# Patient Record
Sex: Female | Born: 1990
Health system: Southern US, Community
[De-identification: ages and names within clinical notes are randomized; demographics above are authoritative.]

## PROBLEM LIST (undated history)

## (undated) DIAGNOSIS — G4700 Insomnia, unspecified: Secondary | ICD-10-CM

## (undated) DIAGNOSIS — F4310 Post-traumatic stress disorder, unspecified: Secondary | ICD-10-CM

## (undated) DIAGNOSIS — F419 Anxiety disorder, unspecified: Secondary | ICD-10-CM

## (undated) DIAGNOSIS — F1990 Other psychoactive substance use, unspecified, uncomplicated: Secondary | ICD-10-CM

## (undated) DIAGNOSIS — F909 Attention-deficit hyperactivity disorder, unspecified type: Secondary | ICD-10-CM

## (undated) DIAGNOSIS — F32A Depression, unspecified: Secondary | ICD-10-CM

## (undated) HISTORY — PX: DENTAL SURGERY: SHX609

---

## 2014-02-08 ENCOUNTER — Encounter (HOSPITAL_COMMUNITY): Payer: Self-pay | Admitting: Emergency Medicine

## 2014-02-08 DIAGNOSIS — Z79899 Other long term (current) drug therapy: Secondary | ICD-10-CM | POA: Insufficient documentation

## 2014-02-08 DIAGNOSIS — Z3202 Encounter for pregnancy test, result negative: Secondary | ICD-10-CM | POA: Insufficient documentation

## 2014-02-08 DIAGNOSIS — N200 Calculus of kidney: Secondary | ICD-10-CM | POA: Insufficient documentation

## 2014-02-08 LAB — URINE MICROSCOPIC-ADD ON

## 2014-02-08 LAB — URINALYSIS, ROUTINE W REFLEX MICROSCOPIC
Bilirubin Urine: NEGATIVE
Glucose, UA: NEGATIVE mg/dL
Ketones, ur: NEGATIVE mg/dL
Nitrite: NEGATIVE
PH: 5.5 (ref 5.0–8.0)
PROTEIN: 30 mg/dL — AB
Specific Gravity, Urine: 1.024 (ref 1.005–1.030)
Urobilinogen, UA: 0.2 mg/dL (ref 0.0–1.0)

## 2014-02-08 LAB — COMPREHENSIVE METABOLIC PANEL
ALBUMIN: 3.9 g/dL (ref 3.5–5.2)
ALT: 36 U/L — AB (ref 0–35)
AST: 29 U/L (ref 0–37)
Alkaline Phosphatase: 61 U/L (ref 39–117)
BUN: 8 mg/dL (ref 6–23)
CHLORIDE: 98 meq/L (ref 96–112)
CO2: 28 meq/L (ref 19–32)
Calcium: 9.7 mg/dL (ref 8.4–10.5)
Creatinine, Ser: 0.69 mg/dL (ref 0.50–1.10)
GFR calc Af Amer: >90 mL/min (ref >90)
Glucose, Bld: 107 mg/dL — ABNORMAL HIGH (ref 70–99)
Potassium: 3.5 mEq/L — ABNORMAL LOW (ref 3.7–5.3)
SODIUM: 140 meq/L (ref 137–147)
Total Bilirubin: 0.2 mg/dL — ABNORMAL LOW (ref 0.3–1.2)
Total Protein: 7.7 g/dL (ref 6.0–8.3)

## 2014-02-08 LAB — CBC WITH DIFFERENTIAL/PLATELET
BASOS ABS: 0 10*3/uL (ref 0.0–0.1)
BASOS PCT: 0 % (ref 0–1)
Eosinophils Absolute: 0.3 10*3/uL (ref 0.0–0.7)
Eosinophils Relative: 4 % (ref 0–5)
HCT: 36.3 % (ref 36.0–46.0)
Hemoglobin: 12.1 g/dL (ref 12.0–15.0)
LYMPHS PCT: 44 % (ref 12–46)
Lymphs Abs: 3.3 10*3/uL (ref 0.7–4.0)
MCH: 29.1 pg (ref 26.0–34.0)
MCHC: 33.3 g/dL (ref 30.0–36.0)
MCV: 87.3 fL (ref 78.0–100.0)
Monocytes Absolute: 0.7 10*3/uL (ref 0.1–1.0)
Monocytes Relative: 10 % (ref 3–12)
NEUTROS ABS: 3.1 10*3/uL (ref 1.7–7.7)
Neutrophils Relative %: 42 % — ABNORMAL LOW (ref 43–77)
PLATELETS: 222 10*3/uL (ref 150–400)
RBC: 4.16 MIL/uL (ref 3.87–5.11)
RDW: 14.1 % (ref 11.5–15.5)
WBC: 7.4 10*3/uL (ref 4.0–10.5)

## 2014-02-08 LAB — POC URINE PREG, ED: Preg Test, Ur: NEGATIVE

## 2014-02-08 LAB — LIPASE, BLOOD: Lipase: 26 U/L (ref 11–59)

## 2014-02-08 MED ORDER — FENTANYL CITRATE 0.05 MG/ML IJ SOLN
50.0000 ug | Freq: Once | INTRAMUSCULAR | Status: AC
  Administered 2014-02-08: 50 ug via INTRAVENOUS
  Filled 2014-02-08: qty 2

## 2014-02-08 MED ORDER — ONDANSETRON HCL 4 MG/2ML IJ SOLN
4.0000 mg | Freq: Once | INTRAMUSCULAR | Status: AC
  Administered 2014-02-08: 4 mg via INTRAVENOUS
  Filled 2014-02-08: qty 2

## 2014-02-08 NOTE — ED Notes (Signed)
Pt. reports right abdominal pain with emesis onset this evening , denies diarrhea/fever or chills. No urinary discomfort .

## 2014-02-09 ENCOUNTER — Emergency Department (HOSPITAL_COMMUNITY): Payer: Self-pay

## 2014-02-09 ENCOUNTER — Emergency Department (HOSPITAL_COMMUNITY)
Admission: EM | Admit: 2014-02-09 | Discharge: 2014-02-09 | Disposition: A | Discharge status: Home / Self Care | Payer: Self-pay | Attending: Emergency Medicine | Admitting: Emergency Medicine

## 2014-02-09 DIAGNOSIS — N201 Calculus of ureter: Secondary | ICD-10-CM

## 2014-02-09 DIAGNOSIS — N23 Unspecified renal colic: Secondary | ICD-10-CM

## 2014-02-09 MED ORDER — TAMSULOSIN HCL 0.4 MG PO CAPS: 0.4000 mg | ORAL_CAPSULE | Freq: Every day | ORAL | Status: DC

## 2014-02-09 MED ORDER — HYDROMORPHONE HCL PF 1 MG/ML IJ SOLN
1.0000 mg | Freq: Once | INTRAMUSCULAR | Status: AC
  Administered 2014-02-09: 1 mg via INTRAVENOUS
  Filled 2014-02-09: qty 1

## 2014-02-09 MED ORDER — ONDANSETRON HCL 4 MG PO TABS: 4.0000 mg | ORAL_TABLET | Freq: Four times a day (QID) | ORAL | Status: DC

## 2014-02-09 MED ORDER — OXYCODONE-ACETAMINOPHEN 5-325 MG PO TABS: 1.0000 | ORAL_TABLET | Freq: Four times a day (QID) | ORAL | Status: DC | PRN

## 2014-02-09 NOTE — ED Notes (Signed)
Pt admits to acute onset of RLQ that began this evening - pt denies n/v/d - admits to no menstrual period x3-4 months. Pt is pleasant, A&Ox4, no acute distress, states pain has not been alleviated by previous pain medication. Pt also admits to urinary frequency and dysuria.

## 2014-02-09 NOTE — ED Notes (Signed)
Pt ambulating independently w/ steady gait on d/c in no acute distress, A&Ox4. D/c instructions reviewed w/ pt - pt denies any further questions or concerns at present. Rx given x3 Pt's friend to drive pt home.

## 2014-02-09 NOTE — Discharge Instructions (Signed)
Return to the ED with any concerns including fever/chills, vomiting and not able to keep down liquids, pain not controlled by pain medications, decreased level of alertness/lethargy, or any other alarming symptoms °

## 2014-02-09 NOTE — ED Provider Notes (Signed)
CSN: [id number]     Arrival date & time 02/08/14  2031 History   First MD Initiated Contact with Patient 02/09/14 [phone]     Chief Complaint  Patient presents with  . Abdominal Pain     (Consider location/radiation/quality/duration/timing/severity/associated sxs/prior Treatment) HPI Pt presents with c/o right lower abdominal pain which began earlier this evening. Pain gradually worsened and then became unbearable.  Pt also feels pain in suprapubic region. No flank pain.  Had one episode of emesis at onset of pain.  No diarrhea.  No fever/chills.  Has not had similar pain in the past.  Pain is fluctuating in nature.  Denies dysuria, no urgency or frequency.  No vaginal bleeding or discharge.  There are no other associated systemic symptoms, there are no other alleviating or modifying factors.   History reviewed. No pertinent past medical history. History reviewed. No pertinent past surgical history. No family history on file. History  Substance Use Topics  . Smoking status: Never Smoker   . Smokeless tobacco: Not on file  . Alcohol Use: No   OB History   Grav Para Term Preterm Abortions TAB SAB Ect Mult Living                 Review of Systems ROS reviewed and all otherwise negative except for mentioned in HPI    Allergies  Review of patient's allergies indicates no known allergies.  Home Medications   Prior to Admission medications   Medication Sig Start Date End Date Taking? Authorizing Provider  ibuprofen (ADVIL,MOTRIN) 200 MG tablet Take 400 mg by mouth every 6 (six) hours as needed for mild pain.   Yes Historical Provider, MD  ondansetron (ZOFRAN) 4 MG tablet Take 1 tablet (4 mg total) by mouth every 6 (six) hours. 02/09/14   Ethelda ChickMartha K Linker, MD  oxyCODONE-acetaminophen (PERCOCET/ROXICET) 5-325 MG per tablet Take 1-2 tablets by mouth every 6 (six) hours as needed for severe pain. 02/09/14   Ethelda ChickMartha K Linker, MD  tamsulosin (FLOMAX) 0.4 MG CAPS capsule Take 1 capsule (0.4 mg  total) by mouth daily. 02/09/14   Ethelda ChickMartha K Linker, MD   BP 119/90  Pulse 100  Temp(Src) 98 F (36.7 C) (Oral)  Resp 16  SpO2 99% Vitals reviewed Physical Exam Physical Examination: General appearance - alert, well appearing, and in no distress Mental status - alert, oriented to person, place, and time Eyes - no conjunctival injection, no scleral icterus Mouth - mucous membranes moist, pharynx normal without lesions Chest - clear to auscultation, no wheezes, rales or rhonchi, symmetric air entry Heart - normal rate, regular rhythm, normal S1, S2, no murmurs, rubs, clicks or gallops Abdomen - soft, nontender, nondistended, no masses or organomegaly Back exam - no CVA tenderness, no midline tenderness to palpation Extremities - peripheral pulses normal, no pedal edema, no clubbing or cyanosis Skin - normal coloration and turgor, no rashes  ED Course  Procedures (including critical care time) Labs Review Labs Reviewed  CBC WITH DIFFERENTIAL - Abnormal; Notable for the following:    Neutrophils Relative % 42 (*)    All other components within normal limits  COMPREHENSIVE METABOLIC PANEL - Abnormal; Notable for the following:    Potassium 3.5 (*)    Glucose, Bld 107 (*)    ALT 36 (*)    Total Bilirubin 0.2 (*)    All other components within normal limits  URINALYSIS, ROUTINE W REFLEX MICROSCOPIC - Abnormal; Notable for the following:    APPearance TURBID (*)  Hgb urine dipstick LARGE (*)    Protein, ur 30 (*)    Leukocytes, UA SMALL (*)    All other components within normal limits  URINE MICROSCOPIC-ADD ON - Abnormal; Notable for the following:    Squamous Epithelial / LPF MANY (*)    Bacteria, UA MANY (*)    Crystals CA OXALATE CRYSTALS (*)    All other components within normal limits  LIPASE, BLOOD  POC URINE PREG, ED    Imaging Review No results found.   EKG Interpretation None      MDM   Final diagnoses:  Right ureteral stone  Renal colic    Pt  presenting with right lower abdominal pain and suprapubic pain.  Labs reassuring.  CT scan reveals 2mm distal ureteral stone. No signs of infection, renal function normal.  Pt treated for pain, will d/w with po meds nad f/u with urology.  Discharged with strict return precautions.  Pt agreeable with plan.  Nursing notes including past medical history and social history reviewed and considered in documentation  There were no prior records available for review in this patient.     Ethelda ChickMartha K Linker, MD 02/13/14 [phone]

## 2014-02-09 NOTE — ED Notes (Signed)
Patient transported to CT 

## 2014-10-21 ENCOUNTER — Encounter (HOSPITAL_COMMUNITY): Payer: Self-pay | Admitting: *Deleted

## 2014-10-21 DIAGNOSIS — Z3202 Encounter for pregnancy test, result negative: Secondary | ICD-10-CM | POA: Insufficient documentation

## 2014-10-21 DIAGNOSIS — Z79899 Other long term (current) drug therapy: Secondary | ICD-10-CM | POA: Insufficient documentation

## 2014-10-21 DIAGNOSIS — R1111 Vomiting without nausea: Secondary | ICD-10-CM | POA: Insufficient documentation

## 2014-10-21 LAB — POC URINE PREG, ED: PREG TEST UR: NEGATIVE

## 2014-10-21 MED ORDER — ONDANSETRON 4 MG PO TBDP
8.0000 mg | ORAL_TABLET | Freq: Once | ORAL | Status: AC
  Administered 2014-10-21: 8 mg via ORAL
  Filled 2014-10-21: qty 2

## 2014-10-21 NOTE — ED Notes (Signed)
She has irregular periods

## 2014-10-21 NOTE — ED Notes (Signed)
n  V since this am.  She is unable to eat.  lmpjan 13th

## 2014-10-22 ENCOUNTER — Emergency Department (HOSPITAL_COMMUNITY)
Admission: EM | Admit: 2014-10-22 | Discharge: 2014-10-22 | Disposition: A | Discharge status: Home / Self Care | Payer: Self-pay | Attending: Emergency Medicine | Admitting: Emergency Medicine

## 2014-10-22 DIAGNOSIS — R1111 Vomiting without nausea: Secondary | ICD-10-CM

## 2014-10-22 LAB — URINALYSIS, ROUTINE W REFLEX MICROSCOPIC
Glucose, UA: NEGATIVE mg/dL
Hgb urine dipstick: NEGATIVE
Ketones, ur: 15 mg/dL — AB
LEUKOCYTES UA: NEGATIVE
Nitrite: NEGATIVE
PH: 6 (ref 5.0–8.0)
Protein, ur: NEGATIVE mg/dL
SPECIFIC GRAVITY, URINE: 1.027 (ref 1.005–1.030)
Urobilinogen, UA: 1 mg/dL (ref 0.0–1.0)

## 2014-10-22 MED ORDER — ONDANSETRON 8 MG PO TBDP: 4.0000 mg | ORAL_TABLET | Freq: Three times a day (TID) | ORAL | Status: DC | PRN

## 2014-10-22 NOTE — ED Notes (Signed)
Patient stated about 1030 Sunday morning she woke up with N/V  Stated she has not been able to keep anything down.  Talked about the BRAT diet.  Patient voiced understanding.

## 2014-10-22 NOTE — Discharge Instructions (Signed)
You were seen today for nausea and vomiting. I suspect her symptoms are likely secondary to a virus. It is important that you stay hydrated. He will be given nausea medication. If you develop abdominal pain, cannot keep down fluids or any new or worsening symptoms she should be evaluated immediately.  Nausea and Vomiting Nausea is a sick feeling that often comes before throwing up (vomiting). Vomiting is a reflex where stomach contents come out of your mouth. Vomiting can cause severe loss of body fluids (dehydration). Children and elderly adults can become dehydrated quickly, especially if they also have diarrhea. Nausea and vomiting are symptoms of a condition or disease. It is important to find the cause of your symptoms. CAUSES   Direct irritation of the stomach lining. This irritation can result from increased acid production (gastroesophageal reflux disease), infection, food poisoning, taking certain medicines (such as nonsteroidal anti-inflammatory drugs), alcohol use, or tobacco use.  Signals from the brain.These signals could be caused by a headache, heat exposure, an inner ear disturbance, increased pressure in the brain from injury, infection, a tumor, or a concussion, pain, emotional stimulus, or metabolic problems.  An obstruction in the gastrointestinal tract (bowel obstruction).  Illnesses such as diabetes, hepatitis, gallbladder problems, appendicitis, kidney problems, cancer, sepsis, atypical symptoms of a heart attack, or eating disorders.  Medical treatments such as chemotherapy and radiation.  Receiving medicine that makes you sleep (general anesthetic) during surgery. DIAGNOSIS Your caregiver may ask for tests to be done if the problems do not improve after a few days. Tests may also be done if symptoms are severe or if the reason for the nausea and vomiting is not clear. Tests may include:  Urine tests.  Blood tests.  Stool tests.  Cultures (to look for evidence of  infection).  X-rays or other imaging studies. Test results can help your caregiver make decisions about treatment or the need for additional tests. TREATMENT You need to stay well hydrated. Drink frequently but in small amounts.You may wish to drink water, sports drinks, clear broth, or eat frozen ice pops or gelatin dessert to help stay hydrated.When you eat, eating slowly may help prevent nausea.There are also some antinausea medicines that may help prevent nausea. HOME CARE INSTRUCTIONS   Take all medicine as directed by your caregiver.  If you do not have an appetite, do not force yourself to eat. However, you must continue to drink fluids.  If you have an appetite, eat a normal diet unless your caregiver tells you differently.  Eat a variety of complex carbohydrates (rice, wheat, potatoes, bread), lean meats, yogurt, fruits, and vegetables.  Avoid high-fat foods because they are more difficult to digest.  Drink enough water and fluids to keep your urine clear or pale yellow.  If you are dehydrated, ask your caregiver for specific rehydration instructions. Signs of dehydration may include:  Severe thirst.  Dry lips and mouth.  Dizziness.  Dark urine.  Decreasing urine frequency and amount.  Confusion.  Rapid breathing or pulse. SEEK IMMEDIATE MEDICAL CARE IF:   You have blood or brown flecks (like coffee grounds) in your vomit.  You have black or bloody stools.  You have a severe headache or stiff neck.  You are confused.  You have severe abdominal pain.  You have chest pain or trouble breathing.  You do not urinate at least once every 8 hours.  You develop cold or clammy skin.  You continue to vomit for longer than 24 to 48 hours.  You have a fever. MAKE SURE YOU:   Understand these instructions.  Will watch your condition.  Will get help right away if you are not doing well or get worse. Document Released: 08/03/2005 Document Revised: 10/26/2011  Document Reviewed: 12/31/2010 [facility] Patient Information 2015 [place]. This information is not intended to replace advice given to you by your health care provider. Make sure you discuss any questions you have with your health care provider.

## 2014-10-22 NOTE — ED Provider Notes (Signed)
CSN: [id number]     Arrival date & time 10/21/14  2310 History   First MD Initiated Contact with Patient 10/22/14 0033     This chart was scribed for Shon Batonourtney F Horton, MD by Arlan OrganAshley Leger, ED Scribe. This patient was seen in room A02C/A02C and the patient's care was started 12:42 AM.   Chief Complaint  Patient presents with  . Emesis   The history is provided by the patient. No language interpreter was used.    HPI Comments: Sandra Guzman is a 24 y.o. female without any pertinent past medical history who presents to the Emergency Department complaining of constant, ongoing vomiting onset early this morning. She also reports mild diffuse abdominal discomfort that is worsened with vomiting. No diarrhea, fever, or chills. Pt admits to previous heroin abuse and states she has been on Methadone since January 2016. Since starting medication, pt has been taking Methadone as prescribed and directed. No recent missed doses. LNMP 1/13. However, pt states her menstrual periods are typically irregular. No known allergies to medications.  History reviewed. No pertinent past medical history. History reviewed. No pertinent past surgical history. No family history on file. History  Substance Use Topics  . Smoking status: Never Smoker   . Smokeless tobacco: Not on file  . Alcohol Use: No   OB History    No data available     Review of Systems  Constitutional: Negative for fever and chills.  Respiratory: Negative for chest tightness and shortness of breath.   Cardiovascular: Negative for chest pain.  Gastrointestinal: Positive for nausea, vomiting and abdominal pain. Negative for diarrhea.  Genitourinary: Negative for dysuria and flank pain.  Musculoskeletal: Negative for back pain.  Neurological: Negative for headaches.  All other systems reviewed and are negative.     Allergies  Review of patient's allergies indicates no known allergies.  Home Medications   Prior to Admission medications    Medication Sig Start Date End Date Taking? Authorizing Provider  diphenhydrAMINE (BENADRYL) 25 MG tablet Take 50 mg by mouth every 6 (six) hours as needed for allergies.   Yes Historical Provider, MD  methadone (DOLOPHINE) 10 MG/ML solution Take 40 mg by mouth daily.   Yes Historical Provider, MD  naproxen sodium (ANAPROX) 220 MG tablet Take 440 mg by mouth 4 (four) times daily as needed (pain).   Yes Historical Provider, MD  ibuprofen (ADVIL,MOTRIN) 200 MG tablet Take 400 mg by mouth every 6 (six) hours as needed for mild pain.    Historical Provider, MD  ondansetron (ZOFRAN) 4 MG tablet Take 1 tablet (4 mg total) by mouth every 6 (six) hours. Patient not taking: Reported on 10/22/2014 02/09/14   Ethelda ChickMartha K Linker, MD  ondansetron (ZOFRAN-ODT) 8 MG disintegrating tablet Take 0.5 tablets (4 mg total) by mouth every 8 (eight) hours as needed for nausea or vomiting. 10/22/14   Shon Batonourtney F Horton, MD  oxyCODONE-acetaminophen (PERCOCET/ROXICET) 5-325 MG per tablet Take 1-2 tablets by mouth every 6 (six) hours as needed for severe pain. Patient not taking: Reported on 10/22/2014 02/09/14   Ethelda ChickMartha K Linker, MD  tamsulosin (FLOMAX) 0.4 MG CAPS capsule Take 1 capsule (0.4 mg total) by mouth daily. Patient not taking: Reported on 10/22/2014 02/09/14   Ethelda ChickMartha K Linker, MD   Triage Vitals: BP 133/83 mmHg  Pulse 120  Temp(Src) 97.6 F (36.4 C)  Resp 18  Ht 5\' 1"  (1.549 m)  Wt 120 lb (54.432 kg)  BMI 22.69 kg/m2  SpO2 97%  LMP 10/08/2014  Physical Exam  Constitutional: She is oriented to person, place, and time. She appears well-developed and well-nourished.  Thin  HENT:  Head: Normocephalic and atraumatic.  Eyes: Pupils are equal, round, and reactive to light.  Cardiovascular: Normal rate, regular rhythm and normal heart sounds.   No murmur heard. Pulmonary/Chest: Effort normal and breath sounds normal. No respiratory distress. She has no wheezes.  Abdominal: Soft. Bowel sounds are normal. There is no  tenderness. There is no rebound.  Musculoskeletal: She exhibits no edema.  Neurological: She is alert and oriented to person, place, and time.  Skin: Skin is warm and dry.  Psychiatric: She has a normal mood and affect.  Nursing note and vitals reviewed.   ED Course  Procedures (including critical care time)  DIAGNOSTIC STUDIES: Oxygen Saturation is 97% on RA, Adequate by my interpretation.    COORDINATION OF CARE: 12:37 AM-Discussed treatment plan with pt at bedside and pt agreed to plan.     Labs Review Labs Reviewed  URINALYSIS, ROUTINE W REFLEX MICROSCOPIC - Abnormal; Notable for the following:    Color, Urine AMBER (*)    APPearance CLOUDY (*)    Bilirubin Urine SMALL (*)    Ketones, ur 15 (*)    All other components within normal limits  POC URINE PREG, ED    Imaging Review No results found.   EKG Interpretation None      MDM   Final diagnoses:  Non-intractable vomiting without nausea, vomiting of unspecified type    Patient presents with vomiting. She is nontoxic on exam. Vital signs are reassuring. Initial heart rate 120; however, on my evaluation, heart rate in the mid 90s. No abdominal tenderness. Patient reports that she has been taking her methadone as prescribed and have low suspicion at this time for withdrawal symptom. She does not appear overtly dehydrated. 15 ketones in the urine. She is not pregnant.  Patient is status post Zofran and tolerating fluids. Discussed with patient likelihood that this is gastritis versus gastroenteritis. Patient was given instructions regarding supportive care.  After history, exam, and medical workup I feel the patient has been appropriately medically screened and is safe for discharge home. Pertinent diagnoses were discussed with the patient. Patient was given return precautions.  I personally performed the services described in this documentation, which was scribed in my presence. The recorded information has been  reviewed and is accurate.   Shon Baton, MD 10/22/14 [phone]

## 2015-03-29 ENCOUNTER — Encounter (HOSPITAL_COMMUNITY): Payer: Self-pay | Admitting: Emergency Medicine

## 2015-03-29 ENCOUNTER — Emergency Department (HOSPITAL_COMMUNITY): Payer: Medicaid Other

## 2015-03-29 ENCOUNTER — Inpatient Hospital Stay (HOSPITAL_COMMUNITY)
Admission: EM | Admit: 2015-03-29 | Discharge: 2015-03-30 | DRG: 781 | Discharge status: Against Medical Advice | Payer: Medicaid Other | Attending: Internal Medicine | Admitting: Internal Medicine

## 2015-03-29 DIAGNOSIS — F1110 Opioid abuse, uncomplicated: Secondary | ICD-10-CM | POA: Diagnosis present

## 2015-03-29 DIAGNOSIS — Z3490 Encounter for supervision of normal pregnancy, unspecified, unspecified trimester: Secondary | ICD-10-CM

## 2015-03-29 DIAGNOSIS — O99331 Smoking (tobacco) complicating pregnancy, first trimester: Secondary | ICD-10-CM | POA: Diagnosis present

## 2015-03-29 DIAGNOSIS — O98811 Other maternal infectious and parasitic diseases complicating pregnancy, first trimester: Secondary | ICD-10-CM | POA: Diagnosis present

## 2015-03-29 DIAGNOSIS — F17210 Nicotine dependence, cigarettes, uncomplicated: Secondary | ICD-10-CM | POA: Diagnosis present

## 2015-03-29 DIAGNOSIS — O99321 Drug use complicating pregnancy, first trimester: Secondary | ICD-10-CM | POA: Diagnosis present

## 2015-03-29 DIAGNOSIS — A419 Sepsis, unspecified organism: Secondary | ICD-10-CM | POA: Diagnosis not present

## 2015-03-29 DIAGNOSIS — Z3A11 11 weeks gestation of pregnancy: Secondary | ICD-10-CM | POA: Diagnosis present

## 2015-03-29 DIAGNOSIS — F1120 Opioid dependence, uncomplicated: Secondary | ICD-10-CM | POA: Diagnosis present

## 2015-03-29 DIAGNOSIS — N12 Tubulo-interstitial nephritis, not specified as acute or chronic: Secondary | ICD-10-CM | POA: Diagnosis not present

## 2015-03-29 DIAGNOSIS — A4151 Sepsis due to Escherichia coli [E. coli]: Secondary | ICD-10-CM | POA: Diagnosis present

## 2015-03-29 DIAGNOSIS — N390 Urinary tract infection, site not specified: Secondary | ICD-10-CM | POA: Diagnosis present

## 2015-03-29 LAB — LIPASE, BLOOD: LIPASE: 13 U/L — AB (ref 22–51)

## 2015-03-29 LAB — CBC WITH DIFFERENTIAL/PLATELET
Basophils Absolute: 0 10*3/uL (ref 0.0–0.1)
Basophils Relative: 0 % (ref 0–1)
EOS ABS: 0 10*3/uL (ref 0.0–0.7)
Eosinophils Relative: 0 % (ref 0–5)
HCT: 32.3 % — ABNORMAL LOW (ref 36.0–46.0)
Hemoglobin: 11.2 g/dL — ABNORMAL LOW (ref 12.0–15.0)
Lymphocytes Relative: 4 % — ABNORMAL LOW (ref 12–46)
Lymphs Abs: 0.6 10*3/uL — ABNORMAL LOW (ref 0.7–4.0)
MCH: 29.7 pg (ref 26.0–34.0)
MCHC: 34.7 g/dL (ref 30.0–36.0)
MCV: 85.7 fL (ref 78.0–100.0)
Monocytes Absolute: 1.4 10*3/uL — ABNORMAL HIGH (ref 0.1–1.0)
Monocytes Relative: 9 % (ref 3–12)
NEUTROS ABS: 13.4 10*3/uL — AB (ref 1.7–7.7)
NEUTROS PCT: 87 % — AB (ref 43–77)
Platelets: 172 10*3/uL (ref 150–400)
RBC: 3.77 MIL/uL — AB (ref 3.87–5.11)
RDW: 13.4 % (ref 11.5–15.5)
WBC: 15.4 10*3/uL — AB (ref 4.0–10.5)

## 2015-03-29 LAB — LACTIC ACID, PLASMA: Lactic Acid, Venous: 2.2 mmol/L (ref 0.5–2.0)

## 2015-03-29 LAB — COMPREHENSIVE METABOLIC PANEL
ALT: 18 U/L (ref 14–54)
AST: 25 U/L (ref 15–41)
Albumin: 3.2 g/dL — ABNORMAL LOW (ref 3.5–5.0)
Alkaline Phosphatase: 56 U/L (ref 38–126)
Anion gap: 11 (ref 5–15)
BILIRUBIN TOTAL: 0.7 mg/dL (ref 0.3–1.2)
BUN: <5 mg/dL — ABNORMAL LOW (ref 6–20)
CALCIUM: 8.9 mg/dL (ref 8.9–10.3)
CO2: 22 mmol/L (ref 22–32)
CREATININE: 0.66 mg/dL (ref 0.44–1.00)
Chloride: 97 mmol/L — ABNORMAL LOW (ref 101–111)
GFR calc Af Amer: >60 mL/min (ref >60)
GLUCOSE: 114 mg/dL — AB (ref 65–99)
Potassium: 3.2 mmol/L — ABNORMAL LOW (ref 3.5–5.1)
Sodium: 130 mmol/L — ABNORMAL LOW (ref 135–145)
Total Protein: 6.7 g/dL (ref 6.5–8.1)

## 2015-03-29 LAB — URINALYSIS, ROUTINE W REFLEX MICROSCOPIC
Bilirubin Urine: NEGATIVE
GLUCOSE, UA: NEGATIVE mg/dL
Ketones, ur: NEGATIVE mg/dL
Nitrite: POSITIVE — AB
PH: 7 (ref 5.0–8.0)
Protein, ur: 100 mg/dL — AB
SPECIFIC GRAVITY, URINE: 1.016 (ref 1.005–1.030)
Urobilinogen, UA: 1 mg/dL (ref 0.0–1.0)

## 2015-03-29 LAB — HCG, QUANTITATIVE, PREGNANCY: hCG, Beta Chain, Quant, S: 104183 m[IU]/mL — ABNORMAL HIGH (ref <5)

## 2015-03-29 LAB — URINE MICROSCOPIC-ADD ON

## 2015-03-29 LAB — POC URINE PREG, ED: Preg Test, Ur: POSITIVE — AB

## 2015-03-29 LAB — MAGNESIUM: Magnesium: 1.3 mg/dL — ABNORMAL LOW (ref 1.7–2.4)

## 2015-03-29 LAB — I-STAT CG4 LACTIC ACID, ED
Lactic Acid, Venous: 0.66 mmol/L (ref 0.5–2.0)
Lactic Acid, Venous: 0.83 mmol/L (ref 0.5–2.0)

## 2015-03-29 MED ORDER — ACETAMINOPHEN 160 MG/5ML PO SOLN
650.0000 mg | Freq: Once | ORAL | Status: AC
  Administered 2015-03-29: 650 mg via ORAL
  Filled 2015-03-29: qty 20.3

## 2015-03-29 MED ORDER — MORPHINE SULFATE 2 MG/ML IJ SOLN
2.0000 mg | Freq: Once | INTRAMUSCULAR | Status: AC
  Administered 2015-03-29: 2 mg via INTRAVENOUS
  Filled 2015-03-29: qty 1

## 2015-03-29 MED ORDER — ONDANSETRON HCL 4 MG/2ML IJ SOLN
4.0000 mg | Freq: Once | INTRAMUSCULAR | Status: AC
  Administered 2015-03-29: 4 mg via INTRAVENOUS
  Filled 2015-03-29: qty 2

## 2015-03-29 MED ORDER — SODIUM CHLORIDE 0.9 % IV SOLN
INTRAVENOUS | Status: DC
  Administered 2015-03-29: 20:00:00 via INTRAVENOUS

## 2015-03-29 MED ORDER — ONDANSETRON HCL 4 MG/2ML IJ SOLN: 4.0000 mg | Freq: Four times a day (QID) | INTRAMUSCULAR | Status: DC | PRN

## 2015-03-29 MED ORDER — POTASSIUM CHLORIDE CRYS ER 20 MEQ PO TBCR
40.0000 meq | EXTENDED_RELEASE_TABLET | Freq: Two times a day (BID) | ORAL | Status: DC
  Administered 2015-03-29: 40 meq via ORAL
  Filled 2015-03-29 (×2): qty 2

## 2015-03-29 MED ORDER — FOLIC ACID 1 MG PO TABS
1.0000 mg | ORAL_TABLET | Freq: Every day | ORAL | Status: DC
  Administered 2015-03-29 – 2015-03-30 (×2): 1 mg via ORAL
  Filled 2015-03-29 (×2): qty 1

## 2015-03-29 MED ORDER — DEXTROSE 5 % IV SOLN
1.0000 g | INTRAVENOUS | Status: DC
  Filled 2015-03-29: qty 10

## 2015-03-29 MED ORDER — SODIUM CHLORIDE 0.9 % IV BOLUS (SEPSIS)
1000.0000 mL | INTRAVENOUS | Status: AC
  Administered 2015-03-29 (×2): 1000 mL via INTRAVENOUS

## 2015-03-29 MED ORDER — SODIUM CHLORIDE 0.9 % IV BOLUS (SEPSIS): 500.0000 mL | Freq: Once | INTRAVENOUS | Status: DC

## 2015-03-29 MED ORDER — DEXTROSE 5 % IV SOLN
2.0000 g | Freq: Once | INTRAVENOUS | Status: DC
  Administered 2015-03-29: 2 g via INTRAVENOUS
  Filled 2015-03-29: qty 2

## 2015-03-29 MED ORDER — METHADONE HCL 10 MG PO TABS
65.0000 mg | ORAL_TABLET | Freq: Every day | ORAL | Status: DC
  Administered 2015-03-30: 65 mg via ORAL
  Filled 2015-03-29: qty 7

## 2015-03-29 MED ORDER — DEXTROSE 5 % IV SOLN
1.0000 g | Freq: Two times a day (BID) | INTRAVENOUS | Status: DC
  Administered 2015-03-30 (×2): 1 g via INTRAVENOUS
  Filled 2015-03-29 (×3): qty 10

## 2015-03-29 MED ORDER — POTASSIUM CHLORIDE 10 MEQ/100ML IV SOLN
10.0000 meq | INTRAVENOUS | Status: DC
  Filled 2015-03-29: qty 100

## 2015-03-29 MED ORDER — ACETAMINOPHEN 500 MG PO TABS
500.0000 mg | ORAL_TABLET | Freq: Four times a day (QID) | ORAL | Status: DC | PRN
  Filled 2015-03-29: qty 1

## 2015-03-29 MED ORDER — MORPHINE SULFATE 2 MG/ML IJ SOLN
2.0000 mg | INTRAMUSCULAR | Status: DC | PRN
  Administered 2015-03-29 – 2015-03-30 (×3): 2 mg via INTRAVENOUS
  Filled 2015-03-29 (×3): qty 1

## 2015-03-29 MED ORDER — SODIUM CHLORIDE 0.9 % IV BOLUS (SEPSIS)
1000.0000 mL | Freq: Once | INTRAVENOUS | Status: AC
  Administered 2015-03-29: 1000 mL via INTRAVENOUS

## 2015-03-29 NOTE — H&P (Signed)
[facility] History and Physical  Sheriden Archibeque ZOX:[id number] DOB: [birth year] DOA: 03/29/2015  Referring physician: EDP PCP: No PCP Per Patient   Chief Complaint: flank pain  HPI: Sandra Guzman is a 24 y.o. female with PMH of heroin abuse, currently on methadone still using Iv heroin intermittently who is [redacted] weeks pregnant, presents to the ER with L flank pain, nausea, chills, feeling feverish, and dysuria. Her symptoms started on Monday and have progressively worsened Due to worsening fo symptoms came to the ER today, noted to have temp of 103, tachycardia, UTI, [redacted] weeks gestation confirmed on [place].   Review of Systems: positives bolded Constitutional:  No weight loss, night sweats, Fevers, chills, fatigue.  HEENT:  No headaches, Difficulty swallowing,Tooth/dental problems,Sore throat,  No sneezing, itching, ear ache, nasal congestion, post nasal drip,  Cardio-vascular:  No chest pain, Orthopnea, PND, swelling in lower extremities, anasarca, dizziness, palpitations  GI:  No heartburn, indigestion, abdominal pain, nausea, vomiting, diarrhea, change in bowel habits, loss of appetite  Resp:  No shortness of breath with exertion or at rest. No excess mucus, no productive cough, No non-productive cough, No coughing up of blood.No change in color of mucus.No wheezing.No chest wall deformity  Skin:  no rash or lesions.  GU:  no dysuria, change in color of urine, no urgency or frequency. No flank pain.  Musculoskeletal:  No joint pain or swelling. No decreased range of motion. No back pain.  Psych:  No change in mood or affect. No depression or anxiety. No memory loss.   History reviewed. No pertinent past medical history. History reviewed. No pertinent past surgical history. Social History:  Now lives with a friend, no ETOH, smokes 1PPD, IVDA, used to use 1.5gm heroin/day -iv until a year ago, now uses methadone and IV DA occasionally  No Known Allergies  family  history -unknown  Prior to Admission medications   Medication Sig Start Date End Date Taking? Authorizing Provider  acetaminophen (TYLENOL) 500 MG tablet Take 2,000 mg by mouth every 6 (six) hours as needed for mild pain.   Yes Historical Provider, MD  diphenhydrAMINE (BENADRYL) 25 MG tablet Take 50 mg by mouth every 6 (six) hours as needed for allergies.   Yes Historical Provider, MD  ondansetron (ZOFRAN) 4 MG tablet Take 1 tablet (4 mg total) by mouth every 6 (six) hours. Patient not taking: Reported on 10/22/2014 02/09/14   Jerelyn Scott, MD  ondansetron (ZOFRAN-ODT) 8 MG disintegrating tablet Take 0.5 tablets (4 mg total) by mouth every 8 (eight) hours as needed for nausea or vomiting. Patient not taking: Reported on 03/29/2015 10/22/14   Shon Baton, MD  oxyCODONE-acetaminophen (PERCOCET/ROXICET) 5-325 MG per tablet Take 1-2 tablets by mouth every 6 (six) hours as needed for severe pain. Patient not taking: Reported on 10/22/2014 02/09/14   Jerelyn Scott, MD  tamsulosin (FLOMAX) 0.4 MG CAPS capsule Take 1 capsule (0.4 mg total) by mouth daily. Patient not taking: Reported on 10/22/2014 02/09/14   Jerelyn Scott, MD   Physical Exam: Filed Vitals:   03/29/15 1400 03/29/15 1434 03/29/15 1630 03/29/15 1700  BP: 103/51  94/44 100/52  Pulse: 100  85 82  Temp:  100.5 F (38.1 C)    TempSrc:  Rectal    Resp: Height:      Weight:      SpO2: 98%  100% 100%    Wt Readings from Last 3 Encounters:  03/29/15 59.903 kg (132 lb 1 oz)  10/21/14  54.432 kg (120 lb)    General:  Ill appearing, but no distress Eyes: PERRL, normal lids, irises & conjunctiva ENT: grossly normal hearing, lips & tongue Neck: no LAD, masses or thyromegaly Cardiovascular: RRR, no m/r/g. No LE edema. Telemetry: SR, no arrhythmias  Respiratory: CTA bilaterally, no w/r/r. Normal respiratory effort. Abdomen: soft, ntnd, L flank tender, BS present Skin: no rash or induration seen on limited exam Musculoskeletal:  grossly normal tone BUE/BLE Psychiatric: grossly normal mood and affect, speech fluent and appropriate Neurologic: grossly non-focal.          Labs on Admission:  Basic Metabolic Panel:  Recent Labs Lab 03/29/15 1346  NA 130*  K 3.2*  CL 97*  CO2 22  GLUCOSE 114*  BUN <5*  CREATININE 0.66  CALCIUM 8.9   Liver Function Tests:  Recent Labs Lab 03/29/15 1346  AST 25  ALT 18  ALKPHOS 56  BILITOT 0.7  PROT 6.7  ALBUMIN 3.2*    Recent Labs Lab 03/29/15 1346  LIPASE 13*   No results for input(s): AMMONIA in the last 168 hours. CBC:  Recent Labs Lab 03/29/15 1346  WBC 15.4*  NEUTROABS 13.4*  HGB 11.2*  HCT 32.3*  MCV 85.7  PLT 172   Cardiac Enzymes: No results for input(s): CKTOTAL, CKMB, CKMBINDEX, TROPONINI in the last 168 hours.  BNP (last 3 results) No results for input(s): BNP in the last 8760 hours.  ProBNP (last 3 results) No results for input(s): PROBNP in the last 8760 hours.  CBG: No results for input(s): GLUCAP in the last 168 hours.  Radiological Exams on Admission: US Ob Comp Less 14 Wks  03/29/2015   CLINICAL DATA:  Positive urine pregnancy test.  EXAM: OBSTETRIC <14 WK ULTRASOUND  TECHNIQUE: Transabdominal ultrasound was performed for evaluation of the gestation as well as the maternal uterus and adnexal regions.  COMPARISON:  None.  FINDINGS: Intrauterine gestational sac: Visualized/normal in shape.  Yolk sac:  Yes  Embryo:  Yes  Cardiac Activity: Yes  Heart Rate: 178 bpm  CRL:   41.3  mm   11 w 0 d                  [place] EDC: 10/18/2015  Maternal uterus/adnexae: No uterine masses. No subchorionic hemorrhage. Cervix is closed. Normal ovaries. No adnexal masses. No free fluid.  IMPRESSION: 1. Single live intrauterine pregnancy with a measured gestational age of [redacted] weeks. 2. No emergent pregnancy or maternal findings.   Electronically Signed   By: Amie Portland M.D.   On: 03/29/2015 16:18   US Renal  03/29/2015   CLINICAL DATA:  LEFT flank pain  for 3 days  EXAM: RENAL / URINARY TRACT ULTRASOUND COMPLETE  COMPARISON:  CT abdomen and pelvis 02/09/2014  FINDINGS: Right Kidney:  Length: 11.1 cm. Normal morphology without mass, hydronephrosis, or shadowing calcification.  Left Kidney:  Length: 11.4 cm. Normal cortical thickness and echogenicity. No mass, hydronephrosis or shadowing calcification.  Bladder:  Normal appearance.  IMPRESSION: Normal renal ultrasound.   Electronically Signed   By: Ulyses Southward M.D.   On: 03/29/2015 16:25    EKG: Independently reviewed. Sinus atchycardic  Assessment/Plan    Sepsis -due to Pyelonephritis -admit , IVF,  -IV ceftriaxone 1gm q12, recommended by OB -IV morphine PRN -FU Urine and Blood CX    Heroin abuse -on methadone, currently 65mg /day -will resume -check UDS -check HIV, Hep C    Pregnancy -11 weeks, Ob US with Single live intrauterine  pregnancy with a measured gestational age of [redacted] weeks. -start Folic acid -FU with OB post discharge    H/o IVDA -FU Blood Cx  Entire case, meds, including methadone, IV morphine and Ceftriaxone including dosing discussed by myself with OBGYN on Call Dr.Eure. For any questions, recommended calling OB on call at [phone]   Code Status: Full Code DVT Prophylaxis: SCDs Family Communication: friend at bedside Disposition Plan: inpatient, home in 1-2days  Time spent:  [facility] Pager [phone]

## 2015-03-29 NOTE — ED Notes (Signed)
Pt attempted to give urine sample. Unable to provide at this moment.

## 2015-03-29 NOTE — ED Provider Notes (Signed)
CSN: [id number]     Arrival date & time 03/29/15  1202 History   First MD Initiated Contact with Patient 03/29/15 1239     Chief Complaint  Patient presents with  . Flank Pain  . Nausea  . 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 Currently left flank pain is rated 9 out of 10. She has had some associated dysuria, urinary frequency, fever, sweats, chills, headache, N, V, body ache and generalized weakness.  Her morning dose of methadone relieved her pain a little bit this morning.  She denies any hematuria.  She states that her LMP was Jan 07, 2015 and she has proof of pregnancy from the local health office.  She has not received any OB care. Patient denies any vaginal bleeding or discharge, she denies any abdominal or pelvic pain or cramping.  She denies any CP, SOB, LE edema, lightheadedness, syncope  History reviewed. No pertinent past medical history. History reviewed. No pertinent past surgical history. No family history on file. Social History  Substance Use Topics  . Smoking status: Never Smoker   . Smokeless tobacco: None  . Alcohol Use: No   OB History    Gravida Para Term Preterm AB TAB SAB Ectopic Multiple Living   1              Review of Systems  HENT: Negative.   Respiratory: Negative for chest tightness, shortness of breath and wheezing.   Cardiovascular: Negative for chest pain, palpitations and leg swelling.  Gastrointestinal: Negative for diarrhea, constipation, blood in stool, anal bleeding and rectal pain.  Endocrine: Negative.   Musculoskeletal:  Negative.   Neurological: Negative for dizziness, syncope, facial asymmetry, light-headedness and numbness.  Psychiatric/Behavioral: Negative.       Allergies  Review of patient's allergies indicates no known allergies.  Home Medications   Prior to Admission medications   Medication Sig Start Date End Date Taking? Authorizing Provider  acetaminophen (TYLENOL) 500 MG tablet Take 2,000 mg by mouth every 6 (six) hours as needed for mild pain.   Yes Historical Provider, MD  diphenhydrAMINE (BENADRYL) 25 MG tablet Take 50 mg by mouth every 6 (six) hours as needed for allergies.   Yes Historical Provider, MD  ondansetron (ZOFRAN) 4 MG tablet Take 1 tablet (4 mg total) by mouth every 6 (six) hours. Patient not taking: Reported on 10/22/2014 02/09/14   Jerelyn Scott, MD  ondansetron (ZOFRAN-ODT) 8 MG disintegrating tablet Take 0.5 tablets (4 mg total) by mouth every 8 (eight) hours as needed for nausea or vomiting. Patient not taking: Reported on 03/29/2015 10/22/14   Shon Baton, MD  oxyCODONE-acetaminophen (PERCOCET/ROXICET) 5-325 MG per tablet Take 1-2 tablets by mouth every 6 (six) hours as needed for severe pain. Patient not taking: Reported on 10/22/2014 02/09/14   Jerelyn Scott, MD  tamsulosin (FLOMAX) 0.4 MG CAPS capsule Take 1 capsule (0.4 mg total) by mouth daily. Patient not taking: Reported on 10/22/2014 02/09/14   Jerelyn Scott, MD   BP 94/44 mmHg  Pulse 85  Temp(Src) 100.5 F (38.1 C) (Rectal)  Resp 22  Ht  (1.626 m)  Wt  132 lb 1 oz (59.903 kg)  BMI 22.66 kg/m2  SpO2 100%  LMP 10/08/2014 Physical Exam  Constitutional: She is oriented to person, place, and time. She appears well-developed and well-nourished. No distress.  HENT:  Head: Normocephalic and atraumatic.  Nose: Nose normal.  Mouth/Throat: Oropharynx is clear and moist. No oropharyngeal exudate.  Eyes: Conjunctivae and EOM are normal. Pupils are equal, round, and reactive to light. Right eye exhibits no  discharge. Left eye exhibits no discharge. No scleral icterus.  Neck: Normal range of motion. No JVD present. No tracheal deviation present. No thyromegaly present.  Cardiovascular: Regular rhythm, normal heart sounds and intact distal pulses.  Tachycardia present.  Exam reveals no gallop and no friction rub.   No murmur heard. Pulmonary/Chest: Effort normal and breath sounds normal. No respiratory distress. She has no wheezes. She has no rales. She exhibits no tenderness.  Abdominal: Soft. Bowel sounds are normal. She exhibits no distension and no mass. There is tenderness. There is no rebound and no guarding.  Left CVA tenderness, tender to palpation across low abdomen, without rebound, guarding, neg Murphy's, neg McBurney's  Musculoskeletal: Normal range of motion. She exhibits no edema or tenderness.  Lymphadenopathy:    She has no cervical adenopathy.  Neurological: She is alert and oriented to person, place, and time. She has normal reflexes. No cranial nerve deficit. She exhibits normal muscle tone. Coordination normal.  Skin: Skin is warm and dry. No rash noted. She is not diaphoretic. No erythema. No pallor.  Psychiatric: She has a normal mood and affect. Her behavior is normal. Judgment and thought content normal.  Nursing note and vitals reviewed.   ED Course  Procedures (including critical care time) Labs Review Labs Reviewed  CBC WITH DIFFERENTIAL/PLATELET - Abnormal; Notable for the following:    WBC 15.4 (*)    RBC 3.77 (*)    Hemoglobin 11.2 (*)    HCT 32.3 (*)    Neutrophils Relative % 87 (*)    Neutro Abs 13.4 (*)    Lymphocytes Relative 4 (*)    Lymphs Abs 0.6 (*)    Monocytes Absolute 1.4 (*)    All other components within normal limits  COMPREHENSIVE METABOLIC PANEL - Abnormal; Notable for the following:    Sodium 130 (*)    Potassium 3.2 (*)    Chloride 97 (*)    Glucose, Bld 114 (*)    BUN <5 (*)    Albumin 3.2 (*)    All other components within normal  limits  LIPASE, BLOOD - Abnormal; Notable for the following:    Lipase 13 (*)    All other components within normal limits  URINALYSIS, ROUTINE W REFLEX MICROSCOPIC (NOT AT [facility]) - Abnormal; Notable for the following:    Color, Urine AMBER (*)    APPearance CLOUDY (*)    Hgb urine dipstick TRACE (*)    Protein, ur 100 (*)    Nitrite POSITIVE (*)    Leukocytes, UA LARGE (*)    All other components within normal limits  HCG, QUANTITATIVE, PREGNANCY - Abnormal; Notable for the following:    hCG, Beta Chain, Quant, [place] [id number] (*)    All other components within normal limits  URINE MICROSCOPIC-ADD ON - Abnormal; Notable for the following:    Squamous Epithelial / LPF FEW (*)    Bacteria, UA MANY (*)    All other components within normal limits  POC URINE PREG, ED - Abnormal; Notable for the following:  Preg Test, Ur POSITIVE (*)    All other components within normal limits  CULTURE, BLOOD (ROUTINE X 2)  CULTURE, BLOOD (ROUTINE X 2)  URINE CULTURE  I-STAT CG4 LACTIC ACID, ED  I-STAT CG4 LACTIC ACID, ED    Imaging Review US Ob Comp Less 14 Wks  03/29/2015   CLINICAL DATA:  Positive urine pregnancy test.  EXAM: OBSTETRIC <14 WK ULTRASOUND  TECHNIQUE: Transabdominal ultrasound was performed for evaluation of the gestation as well as the maternal uterus and adnexal regions.  COMPARISON:  None.  FINDINGS: Intrauterine gestational sac: Visualized/normal in shape.  Yolk sac:  Yes  Embryo:  Yes  Cardiac Activity: Yes  Heart Rate: 178 bpm  CRL:   41.3  mm   11 w 0 d                  [place] EDC: 10/18/2015  Maternal uterus/adnexae: No uterine masses. No subchorionic hemorrhage. Cervix is closed. Normal ovaries. No adnexal masses. No free fluid.  IMPRESSION: 1. Single live intrauterine pregnancy with a measured gestational age of [redacted] weeks. 2. No emergent pregnancy or maternal findings.   Electronically Signed   By: Amie Portland M.D.   On: 03/29/2015 16:18   US Renal  03/29/2015   CLINICAL DATA:  LEFT  flank pain for 3 days  EXAM: RENAL / URINARY TRACT ULTRASOUND COMPLETE  COMPARISON:  CT abdomen and pelvis 02/09/2014  FINDINGS: Right Kidney:  Length: 11.1 cm. Normal morphology without mass, hydronephrosis, or shadowing calcification.  Left Kidney:  Length: 11.4 cm. Normal cortical thickness and echogenicity. No mass, hydronephrosis or shadowing calcification.  Bladder:  Normal appearance.  IMPRESSION: Normal renal ultrasound.   Electronically Signed   By: Ulyses Southward M.D.   On: 03/29/2015 16:25   I, Danelle Berry, personally reviewed and evaluated these images and lab results as part of my medical decision-making.   EKG Interpretation   Date/Time:  Friday March 29 2015 14:32:55 EDT Ventricular Rate:  95 PR Interval:  120 QRS Duration: 79 QT Interval:  362 QTC Calculation: 455 R Axis:   76 Text Interpretation:  Sinus rhythm Nonspecific repol abnormality, diffuse  leads No old tracing to compare Confirmed by [facility]  MD, MARTHA [phone]) on  03/29/2015 2:41:57 PM      MDM   Final diagnoses:  Pregnancy    UTI/Pyelonephritis in the setting of 1st trimester pregnancy in pt with methadone use, high risk, with signs of early sepsis with mild tachycardia, fever, and leukocytosis 15.4 Renal US - negative for hydronephrosis <14 wk OB US - positive for intrauterine pregnancy estimated gestational age 26w  Admitted to medicine      Danelle Berry, PA-C 04/01/15 1032  Jerelyn Scott, MD 04/01/15 [phone]

## 2015-03-29 NOTE — ED Notes (Signed)
Patient states L flank pain with N/V.  Patient states is [redacted] weeks pregnant.   Patient states she is also on methadone.   Denies other symptoms.

## 2015-03-29 NOTE — ED Notes (Signed)
Attempted report to 5W. 

## 2015-03-29 NOTE — ED Notes (Signed)
Cold, wet washcloth placed on pt's forehead.

## 2015-03-29 NOTE — ED Notes (Signed)
Patient transported to Ultrasound 

## 2015-03-30 DIAGNOSIS — A419 Sepsis, unspecified organism: Secondary | ICD-10-CM

## 2015-03-30 DIAGNOSIS — N12 Tubulo-interstitial nephritis, not specified as acute or chronic: Secondary | ICD-10-CM

## 2015-03-30 DIAGNOSIS — F1110 Opioid abuse, uncomplicated: Secondary | ICD-10-CM

## 2015-03-30 LAB — CBC
HCT: 25.6 % — ABNORMAL LOW (ref 36.0–46.0)
Hemoglobin: 8.8 g/dL — ABNORMAL LOW (ref 12.0–15.0)
MCH: 29.3 pg (ref 26.0–34.0)
MCHC: 34.4 g/dL (ref 30.0–36.0)
MCV: 85.3 fL (ref 78.0–100.0)
Platelets: 133 10*3/uL — ABNORMAL LOW (ref 150–400)
RBC: 3 MIL/uL — ABNORMAL LOW (ref 3.87–5.11)
RDW: 13.8 % (ref 11.5–15.5)
WBC: 13.5 10*3/uL — ABNORMAL HIGH (ref 4.0–10.5)

## 2015-03-30 LAB — BASIC METABOLIC PANEL
Anion gap: 6 (ref 5–15)
BUN: <5 mg/dL — ABNORMAL LOW (ref 6–20)
CHLORIDE: 107 mmol/L (ref 101–111)
CO2: 20 mmol/L — ABNORMAL LOW (ref 22–32)
Calcium: 8 mg/dL — ABNORMAL LOW (ref 8.9–10.3)
Creatinine, Ser: 0.43 mg/dL — ABNORMAL LOW (ref 0.44–1.00)
Glucose, Bld: 99 mg/dL (ref 65–99)
POTASSIUM: 3.6 mmol/L (ref 3.5–5.1)
Sodium: 133 mmol/L — ABNORMAL LOW (ref 135–145)

## 2015-03-30 LAB — RAPID URINE DRUG SCREEN, HOSP PERFORMED
AMPHETAMINES: NOT DETECTED
BARBITURATES: NOT DETECTED
BENZODIAZEPINES: NOT DETECTED
Cocaine: NOT DETECTED
OPIATES: POSITIVE — AB
TETRAHYDROCANNABINOL: NOT DETECTED

## 2015-03-30 LAB — LACTIC ACID, PLASMA: LACTIC ACID, VENOUS: 1.1 mmol/L (ref 0.5–2.0)

## 2015-03-30 MED ORDER — MAGNESIUM SULFATE 2 GM/50ML IV SOLN
2.0000 g | Freq: Once | INTRAVENOUS | Status: AC
  Administered 2015-03-30: 2 g via INTRAVENOUS
  Filled 2015-03-30: qty 50

## 2015-03-30 MED ORDER — SODIUM CHLORIDE 0.9 % IV SOLN: INTRAVENOUS | Status: DC

## 2015-03-30 MED ORDER — DEXTROSE 5 % IV SOLN
1.0000 g | Freq: Three times a day (TID) | INTRAVENOUS | Status: DC
  Filled 2015-03-30 (×3): qty 1

## 2015-03-30 MED ORDER — CEFPODOXIME PROXETIL 200 MG PO TABS: 200.0000 mg | ORAL_TABLET | Freq: Two times a day (BID) | ORAL | Status: AC

## 2015-03-30 NOTE — Progress Notes (Signed)
UR Completed. Finnleigh Marchetti, RN, BSN.  [phone] 

## 2015-03-30 NOTE — Discharge Instructions (Signed)
Follow with Primary MD  in 3 days , follow final Blood and Urine culture results, follow with your OB MD in 2 days.  Stop all drug use, smoking and alcohol use.  Get CBC, CMP, 2 view Chest X ray checked  by Primary MD next visit.    Activity: As tolerated with Full fall precautions use walker/cane & assistance as needed   Disposition Home    Diet: Heart Healthy   For Heart failure patients - Check your Weight same time everyday, if you gain over 2 pounds, or you develop in leg swelling, experience more shortness of breath or chest pain, call your Primary MD immediately. Follow Cardiac Low Salt Diet and 1.5 lit/day fluid restriction.   On your next visit with your primary care physician please Get Medicines reviewed and adjusted.   Please request your Prim.MD to go over all Hospital Tests and Procedure/Radiological results at the follow up, please get all Hospital records sent to your Prim MD by signing hospital release before you go home.   If you experience worsening of your admission symptoms, develop shortness of breath, life threatening emergency, suicidal or homicidal thoughts you must seek medical attention immediately by calling 911 or calling your MD immediately  if symptoms less severe.  You Must read complete instructions/literature along with all the possible adverse reactions/side effects for all the Medicines you take and that have been prescribed to you. Take any new Medicines after you have completely understood and accpet all the possible adverse reactions/side effects.   Do not drive, operating heavy machinery, perform activities at heights, swimming or participation in water activities or provide baby sitting services if your were admitted for syncope or siezures until you have seen by Primary MD or a Neurologist and advised to do so again.  Do not drive when taking Pain medications.    Do not take more than prescribed Pain, Sleep and Anxiety Medications  Special  Instructions: If you have smoked or chewed Tobacco  in the last 2 yrs please stop smoking, stop any regular Alcohol  and or any Recreational drug use.  Wear Seat belts while driving.   Please note  You were cared for by a hospitalist during your hospital stay. If you have any questions about your discharge medications or the care you received while you were in the hospital after you are discharged, you can call the unit and asked to speak with the hospitalist on call if the hospitalist that took care of you is not available. Once you are discharged, your primary care physician will handle any further medical issues. Please note that NO REFILLS for any discharge medications will be authorized once you are discharged, as it is imperative that you return to your primary care physician (or establish a relationship with a primary care physician if you do not have one) for your aftercare needs so that they can reassess your need for medications and monitor your lab values.   How a Baby Grows During Pregnancy Pregnancy begins when the female's sperm enters the female's egg. This happens in the fallopian tube and is called fertilization. The fertilized egg is called an embryo until it reaches 9 weeks from the time of fertilization. From 9 weeks until birth it is called a fetus. The fertilized egg moves down the tube into the uterus and attaches to the inside lining of the uterus.  The pregnant woman is responsible for the growth of the embryo/fetus by supplying nourishment and oxygen through the  blood stream and placenta to the developing fetus. The uterus becomes larger and pops out from the abdomen more and more as the fetus develops and grows. A normal pregnancy lasts 280 days, with a range of 259 to 294 days, or 40 weeks. The pregnancy is divided up into three trimesters:  First trimester - 0 to 13 weeks.  Second trimester - 14 to 27 weeks.  Third trimester - 28 to 40 weeks. The day your baby is  supposed to be born is called estimated date of confinement [facility]) or estimated date of delivery (EDD). GROWTH OF THE BABY MONTH BY MONTH  First Month: The fertilized egg attaches to the inside of the uterus and certain cells will form the placenta and others will develop into the fetus. The arms, legs, brain, spinal cord, lungs, and heart begin to develop. At the end of the first month the heart begins to beat. The embryo weighs less than an ounce and is  inch long.  Second Month: The bones can be seen, the inner ear, eye lids, hands and feet form and genitals develop. By the end of 8 weeks, all of the major organs are developing. The fetus now weighs less than an ounce and is one inch (2.54 cm) long.  Third Month: Teeth buds appear, all the internal organs are forming, bones and muscles begin to grow, the spine can flex and the skin is transparent. Finger and toe nails begin to form, the hands develop faster than the feet and the arms are longer than the legs at this point. The fetus weighs a little more than an ounce (0.03 kg) and is 3 inches (8.89cm) long.  Fourth Month: The placenta is completely formed. The external sex organs, neck, outer ear, eyebrows, eyelids and fingernails are formed. The fetus can hear, swallow, flex its arms and legs and the kidney begins to produce urine. The skin is covered with a white waxy coating (vernix) and very thin hair (lanugo) is present. The fetus weighs 5 ounces (0.14kg) and is 6 to 7 inches (16.51cm) long.  Fifth Month: The fetus moves around more and can be felt for the first time (called quickening), sleeps and wakes up at times, may begin to suck its finger and the nails grow to the end of the fingers. The gallbladder is now functioning and helps to digest the nutrients, eggs are formed in the female and the testicles begin to drop down from the abdomen to the scrotum in the female. The fetus weighs  to 1 pound (0.45kg) and is 10 inches (25.4cm)  long.  Sixth Month: The lungs are formed but the fetus does not breath yet. The eyes open, the brain develops more quickly at this time, one can detect finger and toe prints and thicker hair grows. The fetus weighs 1 to 1 pounds (0.68kg) and is 12 inches (30.48cm) long.  Seventh Month: The fetus can hear and respond to sounds, kicks and stretches and can sense changes in light. The fetus weighs 2 to 2 pounds (1.13kg) and is 14 inches (35.56cm) long.  Eight Month: All organs and body systems are fully developed and functioning. The bones get harder, taste buds develop and can taste sweet and sour flavors and the fetus may hiccup now. Different parts of the brain are developing and the skull remains soft for the brain to grow. The fetus weighs 5 pounds (2.27kg) and is 18 inches (45.75cm) long.  Ninth Month: The fetus gains about a half a pound  a week, the lungs are fully developed, patterns of sleep develop and the head moves down into the bottom of the uterus called vertex. If the buttocks moves into the bottom of the uterus, it is called a breech. The fetus weighs 6 to 9 pounds (2.72 to 4.08kg) and is 20 inches (50.8cm) long. You should be informed about your pregnancy, yourself and how the baby is developing as much as possible. Being informed helps you to enjoy this experience. It also gives you the sense to feel if something is not going right and when to ask questions. Talk to your caregiver when you have questions about your baby or your own body. Document Released: 01/20/2008 Document Revised: 10/26/2011 Document Reviewed: 01/20/2008 [facility] Patient Information 2015 [place], [place]. This information is not intended to replace advice given to you by your health care provider. Make sure you discuss any questions you have with your health care provider.   What Do I Need to Know About Injuries During Pregnancy? Trauma is the most common cause of injury and death in pregnant women. This can also  result in significant harm or death of the baby. Your baby is protected in the womb (uterus) by a sac filled with fluid (amniotic sac). Your baby can be harmed if there is direct, high-impact trauma to your abdomen and pelvis. This type of trauma can result in tearing of your uterus, the placenta pulling away from the wall of the uterus (placenta abruption), or the amniotic sac breaking open (rupture of membranes). These injuries can decrease or stop the blood supply to your baby or cause you to go into labor earlier than expected. Minor falls and low-impact automobile accidents do not usually harm your baby, even if they do minimally harm you. WHAT KIND OF INJURIES CAN AFFECT MY PREGNANCY? The most common causes of injury or death to a baby include:  Falls. Falls are more common in the second and third trimester of the pregnancy. Factors that increase your risk of falling include:  Increase in your weight.  The change in your center of gravity.  Tripping over an object that cannot be seen.  Increased looseness (laxity) of your ligaments resulting in less coordinated movements (you may feel clumsy).  Falling during high-risk activities like horseback riding or skiing.  Automobile accidents. It is important to wear your seat belt properly, with the lap belt below your abdomen, and always practice safe driving.  Domestic violence or assault.  Burns ([employer]). The most common causes of injury or death to the pregnant woman include:  Injuries that cause severe bleeding, shock, and loss of blood flow to major organs.  Head and neck injuries that result in severe brain or spinal damage.  Chest trauma that can cause direct injury to the heart and lungs or any injury that affects the area enclosed by the ribs. Trauma to this area can result in cardiorespiratory arrest. WHAT CAN I DO TO PROTECT MYSELF AND MY BABY FROM INJURY WHILE I AM PREGNANT?  Remove slippery rugs and loose  objects on the floor that increase your risk of tripping.  Avoid walking on wet or slippery floors.  Wear comfortable shoes that have a good grip on the sole. Do not wear high-heeled shoes.  Always wear your seat belt properly, with the lap belt below your abdomen, and always practice safe driving. Do not ride on a motorcycle while pregnant.  Do not participate in high-impact activities or sports.  Avoid fires, starting fires,  lifting heavy pots of boiling or hot liquids, and fixing electrical problems.  Only take over-the-counter or prescription medicines for pain, fever, or discomfort as directed by your health care provider.  Know your blood type and the father's blood type in case you develop vaginal bleeding or experience an injury for which a blood transfusion may be necessary.  Call your local emergency services (911 in the [place] if you are a victim of domestic violence or assault. Spousal abuse can be a significant cause of trauma during pregnancy. For help and support, contact the [employer]. WHEN SHOULD I SEEK IMMEDIATE MEDICAL CARE?   You fall on your abdomen or experience any high-force accident or injury.  You have been assaulted (domestic or otherwise).  You have been in a car accident.  You develop vaginal bleeding.  You develop fluid leaking from the vagina.  You develop uterine contractions (pelvic cramping, pain, or significant low back pain).  You become weak or faint, or have uncontrolled vomiting after trauma.  You had a serious burn. This includes burns to the face, neck, hands, or genitals, or burns greater than the size of your palm anywhere else.  You develop neck stiffness or pain after a fall or from other trauma.  You develop a headache or vision problems after a fall or from other trauma.  You do not feel the baby moving or the baby is not moving as much as before a fall or other trauma. Document Released: 09/10/2004  Document Revised: 12/18/2013 Document Reviewed: 05/10/2013 [facility] Patient Information 2015 [place], [place]. This information is not intended to replace advice given to you by your health care provider. Make sure you discuss any questions you have with your health care provider.

## 2015-03-30 NOTE — Progress Notes (Signed)
Reviewed lab work with pt.  Pt stated "Based on my lab work, I don't think the infection is in my blood and I feel like I can control this at home.  I think I am going to go."  Warned pt that if she continues to do heroin it will potentially kill her and her fetus and she is at increased risk of developing an infection. Pt verbalized "I have been sober for a few weeks and I'm doing less than what I was.  I think I will be okay and I plan to stop in August."  Will provide [facility] paperwork.  PIV removed.

## 2015-03-30 NOTE — Progress Notes (Addendum)
Patient Demographics:    Sandra Guzman, is a 24 y.o. female, DOB - 1990/10/13, WUJ:[id number]  Admit date - 03/29/2015   Admitting Physician Zannie Cove, MD  Outpatient Primary MD for the patient is No PCP Per Patient  LOS - 1   Chief Complaint  Patient presents with  . Flank Pain  . Nausea  . Emesis        Subjective:    Sandra Guzman today has, No headache, No chest pain, No abdominal pain - No Nausea, No new weakness tingling or numbness, No Cough - SOB. Pains of left-sided low back pain which has improved.   Assessment  & Plan :     1. UTI/left-sided pyelonephritis. Blood cultures positive for gram-negative rods, responded well clinically to IV Rocephin which will be continued, discussed the case with ID physician Dr. Algis Liming. Will likely require total 10 days of oral anti-biotic based on clinical course. Most likely will be switched to oral Vantin. Continue supportive care with IV fluids, pain control, repeat lactic acid tomorrow.   2. IV heroin abuse with methadone use. Last use of methadone on 03/28/2015 per patient, counseled to quit. Patient counseled and warned about harm to herself and the fetus, We will monitor her here. We will check HIV and acute hepatitis panel.   3. [redacted] weeks pregnant. Ultrasound obtained upon admission stable, started on folic acid. Admitting physician discussed the case with OB physician on call on the day of admission patient to follow with OB post discharge.    Code Status : Full  Family Communication  : Friend bedside  Disposition Plan  : Home in 1-2 days  Consults  :  OB over the phone by admitting physician, ID and Dr. Algis Liming over the phone by me on 03/30/2015  Procedures  : Renal US - Stable  DVT Prophylaxis  : SCDs   Lab Results  Component  Value Date   PLT 133* 03/30/2015    Inpatient Medications  Scheduled Meds: . cefTRIAXone (ROCEPHIN)  IV  1 g Intravenous Q12H  . folic acid  1 mg Oral Daily  . magnesium sulfate 1 - 4 g bolus IVPB  2 g Intravenous Once  . methadone  65 mg Oral Daily  . sodium chloride  500 mL Intravenous Once   Continuous Infusions: . sodium chloride 125 mL/hr at 03/29/15 2017   PRN Meds:.acetaminophen, morphine injection, ondansetron (ZOFRAN) IV  Antibiotics  :    Anti-infectives    Start     Dose/Rate Route Frequency Ordered Stop   03/30/15 0300  cefTRIAXone (ROCEPHIN) 1 g in dextrose 5 % 50 mL IVPB     1 g 100 mL/hr over 30 Minutes Intravenous Every 12 hours 03/29/15 1903     03/29/15 2000  cefTRIAXone (ROCEPHIN) 1 g in dextrose 5 % 50 mL IVPB  Status:  Discontinued     1 g 100 mL/hr over 30 Minutes Intravenous Every 24 hours 03/29/15 1855 03/29/15 1903   03/29/15 1430  cefTRIAXone (ROCEPHIN) 2 g in dextrose 5 % 50 mL IVPB  Status:  Discontinued     2 g 100 mL/hr over 30 Minutes Intravenous  Once 03/29/15 1419 03/29/15 1535        Objective:   [place]  Vitals:   03/29/15 1909 03/29/15 2135 03/30/15 0000 03/30/15 0503  BP: 107/55 102/49  93/49  Pulse: 96 116  92  Temp: 98.6 F (37 C) 100.2 F (37.9 C) 97.9 F (36.6 C) 98.3 F (36.8 C)  TempSrc: Oral Oral Oral Oral  Resp: Height:  (1.549 m)     Weight: 62.5 kg (137 lb 12.6 oz)     SpO2: 100% 99%  100%    Wt Readings from Last 3 Encounters:  03/29/15 62.5 kg (137 lb 12.6 oz)  10/21/14 54.432 kg (120 lb)     Intake/Output Summary (Last 24 hours) at 03/30/15 1020 Last data filed at 03/30/15 0505  Gross per 24 hour  Intake      0 ml  Output   1200 ml  Net  -1200 ml     Physical Exam  Awake Alert, Oriented X 3, No new F.N deficits, Normal affect [place].AT,PERRAL Supple Neck,No JVD, No cervical lymphadenopathy appriciated.  Symmetrical Chest wall movement, Good air movement bilaterally, CTAB RRR,No  Gallops,Rubs or new Murmurs, No Parasternal Heave +ve B.Sounds, Abd Soft, No tenderness, No organomegaly appriciated, No rebound - guarding or rigidity. No Cyanosis, Clubbing or edema, No new Rash or bruise       Data Review:   Micro Results Recent Results (from the past 240 hour(s))  Blood Culture (routine x 2)     Status: None (Preliminary result)   Collection Time: 03/29/15  3:00 PM  Result Value Ref Range Status   Specimen Description BLOOD RIGHT ANTECUBITAL  Final   Special Requests BOTTLES DRAWN AEROBIC AND ANAEROBIC  Final   Culture  Setup Time   Final    GRAM NEGATIVE RODS ANAEROBIC BOTTLE ONLY CRITICAL RESULT CALLED TO, READ BACK BY AND VERIFIED WITH: A Mariel Aloe [id number] WILDERK    Culture GRAM NEGATIVE RODS  Final   Report Status PENDING  Incomplete    Radiology Reports US Ob Comp Less 14 Wks  03/29/2015   CLINICAL DATA:  Positive urine pregnancy test.  EXAM: OBSTETRIC <14 WK ULTRASOUND  TECHNIQUE: Transabdominal ultrasound was performed for evaluation of the gestation as well as the maternal uterus and adnexal regions.  COMPARISON:  None.  FINDINGS: Intrauterine gestational sac: Visualized/normal in shape.  Yolk sac:  Yes  Embryo:  Yes  Cardiac Activity: Yes  Heart Rate: 178 bpm  CRL:   41.3  mm   11 w 0 d                  [place] EDC: 10/18/2015  Maternal uterus/adnexae: No uterine masses. No subchorionic hemorrhage. Cervix is closed. Normal ovaries. No adnexal masses. No free fluid.  IMPRESSION: 1. Single live intrauterine pregnancy with a measured gestational age of [redacted] weeks. 2. No emergent pregnancy or maternal findings.   Electronically Signed   By: Amie Portland M.D.   On: 03/29/2015 16:18   US Renal  03/29/2015   CLINICAL DATA:  LEFT flank pain for 3 days  EXAM: RENAL / URINARY TRACT ULTRASOUND COMPLETE  COMPARISON:  CT abdomen and pelvis 02/09/2014  FINDINGS: Right Kidney:  Length: 11.1 cm. Normal morphology without mass, hydronephrosis, or shadowing calcification.   Left Kidney:  Length: 11.4 cm. Normal cortical thickness and echogenicity. No mass, hydronephrosis or shadowing calcification.  Bladder:  Normal appearance.  IMPRESSION: Normal renal ultrasound.   Electronically Signed   By: Ulyses Southward M.D.   On: 03/29/2015 16:25     CBC  Recent Labs Lab 03/29/15 1346 03/30/15 0733  WBC 15.4* 13.5*  HGB 11.2* 8.8*  HCT 32.3* 25.6*  PLT 172 133*  MCV 85.7 85.3  MCH 29.7 29.3  MCHC 34.7 34.4  RDW 13.4 13.8  LYMPHSABS 0.6*  --   MONOABS 1.4*  --   EOSABS 0.0  --   BASOSABS 0.0  --     Chemistries   Recent Labs Lab 03/29/15 1346 03/29/15 2010 03/30/15 0733  NA 130*  --  133*  K 3.2*  --  3.6  CL 97*  --  107  CO2 22  --  20*  GLUCOSE 114*  --  99  BUN <5*  --  <5*  CREATININE 0.66  --  0.43*  CALCIUM 8.9  --  8.0*  MG  --  1.3*  --   AST 25  --   --   ALT 18  --   --   ALKPHOS 56  --   --   BILITOT 0.7  --   --    ------------------------------------------------------------------------------------------------------------------ estimated creatinine clearance is 91.9 mL/min (by C-G formula based on Cr of 0.43). ------------------------------------------------------------------------------------------------------------------ No results for input(s): HGBA1C in the last 72 hours. ------------------------------------------------------------------------------------------------------------------ No results for input(s): CHOL, HDL, LDLCALC, TRIG, CHOLHDL, LDLDIRECT in the last 72 hours. ------------------------------------------------------------------------------------------------------------------ No results for input(s): TSH, T4TOTAL, T3FREE, THYROIDAB in the last 72 hours.  Invalid input(s): FREET3 ------------------------------------------------------------------------------------------------------------------ No results for input(s): VITAMINB12, FOLATE, FERRITIN, TIBC, IRON, RETICCTPCT in the last 72 hours.  Coagulation profile No  results for input(s): INR, PROTIME in the last 168 hours.  No results for input(s): DDIMER in the last 72 hours.  Cardiac Enzymes No results for input(s): CKMB, TROPONINI, MYOGLOBIN in the last 168 hours.  Invalid input(s): CK ------------------------------------------------------------------------------------------------------------------ Invalid input(s): POCBNP   Time Spent in minutes   35   Hayven Croy K M.D on 03/30/2015 at 10:20 AM  Between 7am to 7pm - Pager - [phone]  After 7pm go to [email or web address] - password [facility]  [facility] -  Office  [phone]

## 2015-03-30 NOTE — Progress Notes (Addendum)
Called to patient's room regarding "IV burning." Informed that it may be magnesium infusion.  Pt stated "I don't want it anymore."  Offered heat pack to IV site.  Pt stated "No, I just want to stop it."  IV infusion stopped.  Appears pt has received more than half of the infusion. WIll continue to monitor.

## 2015-03-30 NOTE — Progress Notes (Signed)
ANTIBIOTIC CONSULT NOTE - INITIAL  Pharmacy Consult:  Sandra Guzman Indication:  Bacteremia  No Known Allergies  Patient Measurements: Height:  (154.9 cm) Weight: 137 lb 12.6 oz (62.5 kg) IBW/kg (Calculated) : 47.8  Vital Signs: Temp: 98.4 F (36.9 C) (08/13 1451) Temp Source: Oral (08/13 1451) BP: 95/46 mmHg (08/13 1451) Pulse Rate: 96 (08/13 1451) Intake/Output from previous day: 08/12 0701 - 08/13 0700 In: -  Out: 1200 [Urine:1200]  Labs:  Recent Labs  03/29/15 1346 03/30/15 0733  WBC 15.4* 13.5*  HGB 11.2* 8.8*  PLT 172 133*  CREATININE 0.66 0.43*   Estimated Creatinine Clearance: 91.9 mL/min (by C-G formula based on Cr of 0.43). No results for input(s): VANCOTROUGH, VANCOPEAK, VANCORANDOM, GENTTROUGH, GENTPEAK, GENTRANDOM, TOBRATROUGH, TOBRAPEAK, TOBRARND, AMIKACINPEAK, AMIKACINTROU, AMIKACIN in the last 72 hours.   Microbiology: Recent Results (from the past 720 hour(s))  Urine culture     Status: None (Preliminary result)   Collection Time: 03/29/15  2:23 PM  Result Value Ref Range Status   Specimen Description URINE, CLEAN CATCH  Final   Special Requests NONE  Final   Culture >=100,000 COLONIES/mL GRAM NEGATIVE RODS  Final   Report Status PENDING  Incomplete  Blood Culture (routine x 2)     Status: None (Preliminary result)   Collection Time: 03/29/15  2:45 PM  Result Value Ref Range Status   Specimen Description BLOOD RIGHT HAND  Final   Special Requests BOTTLES DRAWN AEROBIC AND ANAEROBIC  Final   Culture NO GROWTH < 24 HOURS  Final   Report Status PENDING  Incomplete  Blood Culture (routine x 2)     Status: None (Preliminary result)   Collection Time: 03/29/15  3:00 PM  Result Value Ref Range Status   Specimen Description BLOOD RIGHT ANTECUBITAL  Final   Special Requests BOTTLES DRAWN AEROBIC AND ANAEROBIC  Final   Culture  Setup Time   Final    GRAM NEGATIVE RODS ANAEROBIC BOTTLE ONLY CRITICAL RESULT CALLED TO, READ BACK BY AND VERIFIED  WITH: A Mariel Aloe [id number] WILDERK    Culture GRAM NEGATIVE RODS  Final   Report Status PENDING  Incomplete    Medical History: History reviewed. No pertinent past medical history.    Assessment: 32 YOF who is [redacted] weeks pregnant admitted with sepsis.  Pharmacy consulted to change from Rocephin to [facility] for GNR bacteremia and UTI/pyelonephritis.  Patient's renal function is stable.  CTX 8/12 >> 8/13 Sandra Guzman 8/13 >>  8/12 BCx x2 - GNR (1 of 2) 8/12 UCx - GNR   Goal of Therapy:  Resolution of infection   Plan:  - Fortaz 1gm IV Q8H (Pregnancy B) - Monitor renal fxn, clinical progress, micro data - Consider starting prenatal vitamin    Dasean Brow D. Laney Guzman, PharmD, BCPS Pager:  [phone] 03/30/2015, 4:27 PM

## 2015-03-30 NOTE — Progress Notes (Signed)
Received panic value for lactic acid level of 2.2, notified. Benedetto Coons N.P. Received new orders for 500 cc bolus of Normal Saline. Patient updated on changes of plan of care.

## 2015-03-31 LAB — URINE CULTURE

## 2015-03-31 LAB — HEPATITIS PANEL, ACUTE
HEP A IGM: NEGATIVE
HEP B S AG: NEGATIVE
Hep B C IgM: NEGATIVE

## 2015-03-31 LAB — HIV ANTIBODY (ROUTINE TESTING W REFLEX): HIV Screen 4th Generation wRfx: NONREACTIVE

## 2015-03-31 NOTE — Progress Notes (Signed)
AMA  Patient left AMA, patient had been warned that this is not Medically advisable at this time by me early in the morning with an at leasr 20 minute talk in the presence of RN during am rounds about quitting drugs and adhering to the Antibiotics for her and her fetus's health and warned that non compliance can result in Medical complications like Death and Disability of her and her fetus, patient understands and accepts the risks involved and assumes full responsibilty of this decision.  I did send a 2 week ABX prescription to her pharmacy along with instructions for her.  Follow-up Information    Follow up with [facility]    . Schedule an appointment as soon as possible for a visit in 2 days.   Contact information:   [address] [place] [address] [phone]      Follow up with [facility]. Schedule an appointment as soon as possible for a visit in 2 days.   Contact information:   [address]  [address] [place] [address] [phone]      Follow up with [facility]             . Schedule an appointment as soon as possible for a visit in 2 days.   Contact information:   [address] [place] [address]         Medication List    TAKE these medications        cefpodoxime 200 MG tablet  Commonly known as:  VANTIN  Take 1 tablet (200 mg total) by mouth 2 (two) times daily.      ASK your doctor about these medications        acetaminophen 500 MG tablet  Commonly known as:  TYLENOL  Take 2,000 mg by mouth every 6 (six) hours as needed for mild pain.     diphenhydrAMINE 25 MG tablet  Commonly known as:  BENADRYL  Take 50 mg by mouth every 6 (six) hours as needed for allergies.       methadone 10 MG tablet  Commonly known as:  DOLOPHINE  Take 65 mg by mouth daily.       Leroy Sea M.D on 03/31/2015 at 6:24 AM  [facility]    Last Note Below  Patient Demographics:    Sandra Guzman, is a 23 y.o. female, DOB - 21-May-1991, WUJ:[id number]  Admit date - 03/29/2015   Admitting Physician Zannie Cove, MD  Outpatient Primary MD for the patient is No PCP Per Patient  LOS - 1   Chief Complaint  Patient presents with  . Flank Pain  . Nausea  . Emesis        Subjective:    Sandra Guzman today has, No headache, No chest pain, No abdominal pain - No Nausea, No new weakness tingling or numbness, No Cough - SOB. Pains of left-sided low back pain which has improved.   Assessment  & Plan :     1. UTI/left-sided pyelonephritis. Blood cultures positive for gram-negative rods, responded well clinically to IV Rocephin which will be continued, discussed the case with ID physician Dr. Algis Liming. Will likely require total 10 days of oral anti-biotic based on clinical course. Most likely will be switched to oral Vantin. Continue supportive care with IV fluids, pain control, repeat lactic acid tomorrow.   2. IV heroin abuse with methadone use. Last use of methadone on 03/28/2015 per patient, counseled to quit. Patient counseled and warned about harm to herself and the fetus, We will monitor her here. We will check HIV and acute hepatitis panel.   3. [redacted] weeks pregnant. Ultrasound obtained upon admission stable, started on folic acid. Admitting physician discussed the case with OB physician on call on the day of admission patient to follow with OB post discharge.    Code Status : Full  Family Communication  : Friend bedside  Disposition Plan  : Home in 1-2  days  Consults  :  OB over the phone by admitting physician, ID and Dr. Algis Liming over the phone by me on 03/30/2015  Procedures  : Renal US - Stable  DVT Prophylaxis  : SCDs   Lab Results  Component Value Date   PLT 133* 03/30/2015    Inpatient Medications  Scheduled Meds:  Continuous Infusions:  PRN Meds:.  Antibiotics  :    Anti-infectives    Start     Dose/Rate Route Frequency Ordered Stop   03/30/15 1700  cefTAZidime (FORTAZ) 1 g in dextrose 5 % 50 mL IVPB  Status:  Discontinued     1 g 100 mL/hr over 30 Minutes Intravenous 3 times per day 03/30/15 1629 03/30/15 2348   03/30/15 0300  cefTRIAXone (ROCEPHIN) 1 g in dextrose 5 % 50 mL IVPB  Status:  Discontinued     1 g 100 mL/hr over 30 Minutes Intravenous Every 12 hours 03/29/15 1903 03/30/15 1615   03/30/15 0000  cefpodoxime (VANTIN) 200 MG tablet     200 mg Oral 2 times daily 03/30/15 1827     03/29/15 2000  cefTRIAXone (ROCEPHIN) 1 g in dextrose 5 % 50 mL IVPB  Status:  Discontinued     1 g 100 mL/hr over 30 Minutes Intravenous Every 24 hours 03/29/15 1855 03/29/15 1903   03/29/15 1430  cefTRIAXone (ROCEPHIN) 2 g in dextrose 5 % 50 mL IVPB  Status:  Discontinued     2 g 100 mL/hr over 30 Minutes Intravenous  Once 03/29/15 1419 03/29/15 1535        Objective:   Filed Vitals:   03/29/15 2135 03/30/15 0000 03/30/15 0503 03/30/15 1451  BP: 102/49  93/49 95/46  Pulse: 116  92 96  Temp: 100.2 F (37.9 C) 97.9 F (36.6 C) 98.3 F (36.8 C) 98.4 F (36.9 C)  TempSrc: Oral Oral Oral Oral  Resp: 16  18 14   Height:      Weight:      SpO2: 99%  100% 100%    Wt Readings from Last 3 Encounters:  03/29/15 62.5 kg (137 lb 12.6 oz)  10/21/14 54.432 kg (120 lb)    No intake or output data in the 24 hours ending 03/31/15 1610   Physical Exam  Awake Alert, Oriented X 3, No new F.N deficits, Normal affect [place].AT,PERRAL Supple Neck,No JVD, No cervical lymphadenopathy appriciated.  Symmetrical Chest wall movement,  Good air movement bilaterally, CTAB RRR,No Gallops,Rubs or new Murmurs, No Parasternal Heave +ve B.Sounds, Abd Soft, No tenderness, No organomegaly appriciated, No rebound - guarding or rigidity. No Cyanosis, Clubbing or edema, No new Rash or bruise       Data Review:   Micro Results Recent Results (from the past 240 hour(s))  Urine culture     Status: None (Preliminary result)   Collection Time: 03/29/15  2:23 PM  Result Value Ref Range Status   Specimen Description URINE, CLEAN CATCH  Final   Special Requests NONE  Final   Culture >=100,000 COLONIES/mL GRAM NEGATIVE RODS  Final   Report Status PENDING  Incomplete  Blood Culture (routine x 2)     Status: None (Preliminary result)   Collection Time: 03/29/15  2:45 PM  Result Value Ref Range Status   Specimen Description BLOOD RIGHT HAND  Final   Special Requests BOTTLES DRAWN AEROBIC AND ANAEROBIC  Final   Culture NO GROWTH < 24 HOURS  Final   Report Status PENDING  Incomplete  Blood Culture (routine x 2)     Status: None (Preliminary result)   Collection Time: 03/29/15  3:00 PM  Result Value Ref Range Status   Specimen Description BLOOD RIGHT ANTECUBITAL  Final   Special Requests BOTTLES DRAWN AEROBIC AND ANAEROBIC  Final   Culture  Setup Time   Final    GRAM NEGATIVE RODS ANAEROBIC BOTTLE ONLY CRITICAL RESULT CALLED TO, READ BACK BY AND VERIFIED WITH: A Mariel Aloe [phone] WILDERK    Culture GRAM NEGATIVE RODS  Final   Report Status PENDING  Incomplete    Radiology Reports US Ob Comp Less 14 Wks  03/29/2015   CLINICAL DATA:  Positive urine pregnancy test.  EXAM: OBSTETRIC <14 WK ULTRASOUND  TECHNIQUE: Transabdominal ultrasound was performed for evaluation of the gestation as well as the maternal uterus and adnexal regions.  COMPARISON:  None.  FINDINGS: Intrauterine gestational sac: Visualized/normal in shape.  Yolk sac:  Yes  Embryo:  Yes  Cardiac Activity: Yes  Heart Rate: 178 bpm  CRL:   41.3  mm   11 w 0 d                   [place] EDC: 10/18/2015  Maternal uterus/adnexae: No uterine masses. No subchorionic hemorrhage. Cervix is closed. Normal ovaries. No adnexal masses. No free fluid.  IMPRESSION: 1. Single live intrauterine pregnancy with a measured gestational age of [redacted] weeks. 2. No emergent pregnancy or maternal findings.   Electronically Signed   By: Amie Portland M.D.   On: 03/29/2015 16:18   US Renal  03/29/2015   CLINICAL DATA:  LEFT flank pain for 3 days  EXAM: RENAL / URINARY TRACT ULTRASOUND COMPLETE  COMPARISON:  CT abdomen and pelvis 02/09/2014  FINDINGS: Right Kidney:  Length: 11.1 cm. Normal morphology without mass, hydronephrosis, or shadowing calcification.  Left Kidney:  Length:  11.4 cm. Normal cortical thickness and echogenicity. No mass, hydronephrosis or shadowing calcification.  Bladder:  Normal appearance.  IMPRESSION: Normal renal ultrasound.   Electronically Signed   By: Ulyses Southward M.D.   On: 03/29/2015 16:25     CBC  Recent Labs Lab 03/29/15 1346 03/30/15 0733  WBC 15.4* 13.5*  HGB 11.2* 8.8*  HCT 32.3* 25.6*  PLT 172 133*  MCV 85.7 85.3  MCH 29.7 29.3  MCHC 34.7 34.4  RDW 13.4 13.8  LYMPHSABS 0.6*  --   MONOABS 1.4*  --   EOSABS 0.0  --   BASOSABS 0.0  --     Chemistries   Recent Labs Lab 03/29/15 1346 03/29/15 2010 03/30/15 0733  NA 130*  --  133*  K 3.2*  --  3.6  CL 97*  --  107  CO2 22  --  20*  GLUCOSE 114*  --  99  BUN <5*  --  <5*  CREATININE 0.66  --  0.43*  CALCIUM 8.9  --  8.0*  MG  --  1.3*  --   AST 25  --   --   ALT 18  --   --   ALKPHOS 56  --   --   BILITOT 0.7  --   --    ------------------------------------------------------------------------------------------------------------------ estimated creatinine clearance is 91.9 mL/min (by C-G formula based on Cr of 0.43). ------------------------------------------------------------------------------------------------------------------ No results for input(s): HGBA1C in the last 72  hours. ------------------------------------------------------------------------------------------------------------------ No results for input(s): CHOL, HDL, LDLCALC, TRIG, CHOLHDL, LDLDIRECT in the last 72 hours. ------------------------------------------------------------------------------------------------------------------ No results for input(s): TSH, T4TOTAL, T3FREE, THYROIDAB in the last 72 hours.  Invalid input(s): FREET3 ------------------------------------------------------------------------------------------------------------------ No results for input(s): VITAMINB12, FOLATE, FERRITIN, TIBC, IRON, RETICCTPCT in the last 72 hours.  Coagulation profile No results for input(s): INR, PROTIME in the last 168 hours.  No results for input(s): DDIMER in the last 72 hours.  Cardiac Enzymes No results for input(s): CKMB, TROPONINI, MYOGLOBIN in the last 168 hours.  Invalid input(s): CK ------------------------------------------------------------------------------------------------------------------ Invalid input(s): POCBNP   Time Spent in minutes   35   Susa Raring K M.D on 03/31/2015 at 6:23 AM  Between 7am to 7pm - Pager - [phone]  After 7pm go to [email or web address] - password [facility]  [facility] -  Office  [phone]

## 2015-04-03 LAB — CULTURE, BLOOD (ROUTINE X 2): CULTURE: NO GROWTH

## 2015-04-05 NOTE — Discharge Summary (Signed)
AMA  Patient left AMA, patient had been warned that this is not Medically advisable at this time by me early in the morning with an at leasr 20 minute talk in the presence of RN during am rounds about quitting drugs and adhering to the Antibiotics for her and her fetus's health and warned that non compliance can result in Medical complications like Death and Disability of her and her fetus, patient understands and accepts the risks involved and assumes full responsibilty of this decision.  I did send a 2 week ABX prescription to her pharmacy along with instructions for her.      Follow-up Information    Follow up with [facility]    . Schedule an appointment as soon as possible for a visit in 2 days.   Contact information:   [address] [place] [address] [phone]      Follow up with [facility]. Schedule an appointment as soon as possible for a visit in 2 days.   Contact information:   [address] [place] [address] [phone]      Follow up with [facility]             . Schedule an appointment as soon as possible for a visit in 2 days.   Contact information:   [address] [place] [address]         Medication List    TAKE these medications        cefpodoxime 200 MG tablet  Commonly known as:  VANTIN  Take 1 tablet (200 mg total) by mouth 2 (two) times daily.      ASK your doctor about these medications        acetaminophen 500 MG tablet  Commonly known as:  TYLENOL  Take 2,000 mg by mouth every 6 (six) hours as needed for mild pain.     diphenhydrAMINE 25 MG tablet  Commonly known as:  BENADRYL  Take 50 mg by mouth every 6 (six) hours as needed for  allergies.     methadone 10 MG tablet  Commonly known as:  DOLOPHINE  Take 65 mg by mouth daily.       Leroy Sea M.D on 04/05/2015 at 6:00 PM  [facility]    Last Note Below  Patient Demographics:    Albirtha Guzman, is a 24 y.o. female, DOB - 1990/12/06, ZOX:[id number]  Admit date - 03/29/2015   Admitting Physician Zannie Cove, MD  Outpatient Primary MD for the patient is No PCP Per Patient  LOS - 1   Chief Complaint  Patient presents with  . Flank Pain  . Nausea  . Emesis        Subjective:    Sandra Guzman today has, No headache, No chest pain, No abdominal pain - No Nausea, No new weakness tingling or numbness, No Cough - SOB. Pains of left-sided low back pain which has improved.   Assessment  & Plan :     1. UTI/left-sided pyelonephritis. Blood cultures positive for gram-negative rods, responded well clinically to IV Rocephin which will be continued, discussed the case with ID physician Dr. Algis Liming. Will likely require total 10 days of oral anti-biotic based on clinical course. Most likely will be switched to oral Vantin. Continue supportive care with IV fluids, pain control, repeat lactic acid tomorrow.   2. IV heroin abuse with methadone use. Last use of methadone on 03/28/2015 per patient, counseled to quit. Patient counseled and warned about harm to herself and the fetus, We will monitor her here. We will check HIV and acute hepatitis panel.   3. [redacted] weeks pregnant. Ultrasound obtained upon admission stable, started on folic acid. Admitting physician discussed the case with OB physician on call on the day of admission patient to follow with OB post discharge.    Code Status : Full  Family Communication  : Friend bedside  Disposition Plan  : Home  in 1-2 days  Consults  :  OB over the phone by admitting physician, ID and Dr. Algis Liming over the phone by me on 03/30/2015  Procedures  : Renal US - Stable  DVT Prophylaxis  : SCDs   Lab Results  Component Value Date   PLT 133* 03/30/2015    Inpatient Medications  Scheduled Meds:  Continuous Infusions:  PRN Meds:.  Antibiotics  :    Anti-infectives    Start     Dose/Rate Route Frequency Ordered Stop   03/30/15 1700  cefTAZidime (FORTAZ) 1 g in dextrose 5 % 50 mL IVPB  Status:  Discontinued     1 g 100 mL/hr over 30 Minutes Intravenous 3 times per day 03/30/15 1629 03/30/15 2348   03/30/15 0300  cefTRIAXone (ROCEPHIN) 1 g in dextrose 5 % 50 mL IVPB  Status:  Discontinued     1 g 100 mL/hr over 30 Minutes Intravenous Every 12 hours 03/29/15 1903 03/30/15 1615   03/30/15 0000  cefpodoxime (VANTIN) 200 MG tablet     200 mg Oral 2 times daily 03/30/15 1827     03/29/15 2000  cefTRIAXone (ROCEPHIN) 1 g in dextrose 5 % 50 mL IVPB  Status:  Discontinued     1 g 100 mL/hr over 30 Minutes Intravenous Every 24 hours 03/29/15 1855 03/29/15 1903   03/29/15 1430  cefTRIAXone (ROCEPHIN) 2 g in dextrose 5 % 50 mL IVPB  Status:  Discontinued     2 g 100 mL/hr over 30 Minutes Intravenous  Once 03/29/15 1419 03/29/15 1535        Objective:   Filed Vitals:   03/29/15 2135 03/30/15 0000 03/30/15 0503 03/30/15 1451  BP: 102/49  93/49 95/46  Pulse: 116  92 96  Temp: 100.2 F (37.9 C) 97.9 F (36.6 C) 98.3 F (36.8 C) 98.4 F (36.9 C)  TempSrc: Oral Oral Oral Oral  Resp: 16  18 14   Height:      Weight:      SpO2: 99%  100% 100%    Wt Readings from Last 3 Encounters:  03/29/15 62.5 kg (137 lb 12.6 oz)  10/21/14 54.432 kg (120 lb)    No intake or output data in the 24 hours ending 04/05/15 1800   Physical Exam  Awake Alert, Oriented X 3, No new F.N deficits, Normal affect Sandra Guzman,PERRAL Supple Neck,No JVD, No cervical lymphadenopathy appriciated.  Symmetrical Chest wall  movement, Good air movement bilaterally, CTAB RRR,No Gallops,Rubs or new Murmurs, No Parasternal Heave +ve B.Sounds, Abd Soft, No tenderness, No organomegaly appriciated, No rebound - guarding or rigidity. No Cyanosis, Clubbing or edema, No new Rash or bruise       Data Review:   Micro Results Recent Results (from the past 240 hour(s))  Urine culture     Status: None   Collection Time: 03/29/15  2:23 PM  Result Value Ref Range Status   Specimen Description URINE, CLEAN CATCH  Final   Special Requests NONE  Final   Culture >=100,000 COLONIES/mL ESCHERICHIA COLI  Final   Report Status 03/31/2015 FINAL  Final   Organism ID, Bacteria ESCHERICHIA COLI  Final      Susceptibility   Escherichia coli - MIC*    AMPICILLIN <=2 SENSITIVE Sensitive     CEFAZOLIN <=4 SENSITIVE Sensitive     CEFTRIAXONE <=1 SENSITIVE Sensitive     CIPROFLOXACIN <=0.25 SENSITIVE Sensitive     GENTAMICIN <=1 SENSITIVE Sensitive     IMIPENEM <=0.25 SENSITIVE Sensitive     NITROFURANTOIN <=16 SENSITIVE Sensitive     TRIMETH/SULFA <=20 SENSITIVE Sensitive     AMPICILLIN/SULBACTAM <=2 SENSITIVE Sensitive     PIP/TAZO <=4 SENSITIVE Sensitive     * >=100,000 COLONIES/mL ESCHERICHIA COLI  Blood Culture (routine x 2)     Status: None   Collection Time: 03/29/15  2:45 PM  Result Value Ref Range Status   Specimen Description BLOOD RIGHT HAND  Final   Special Requests BOTTLES DRAWN AEROBIC AND ANAEROBIC  Final   Culture NO GROWTH 5 DAYS  Final   Report Status 04/03/2015 FINAL  Final  Blood Culture (routine x 2)     Status: None   Collection Time: 03/29/15  3:00 PM  Result Value Ref Range Status   Specimen Description BLOOD RIGHT ANTECUBITAL  Final   Special Requests BOTTLES DRAWN AEROBIC AND ANAEROBIC  Final   Culture  Setup Time   Final    GRAM NEGATIVE RODS ANAEROBIC BOTTLE ONLY CRITICAL RESULT CALLED TO, READ BACK BY AND VERIFIED WITH: A BROWN,RN [id number] WILDERK    Culture ESCHERICHIA COLI  Final     Report Status 04/03/2015 FINAL  Final   Organism ID, Bacteria ESCHERICHIA COLI  Final      Susceptibility   Escherichia coli - MIC*    AMPICILLIN <=2 SENSITIVE Sensitive     CEFAZOLIN <=4 SENSITIVE Sensitive     CEFEPIME <=1 SENSITIVE Sensitive     CEFTAZIDIME <=1 SENSITIVE Sensitive     CEFTRIAXONE <=1 SENSITIVE Sensitive     CIPROFLOXACIN <=0.25 SENSITIVE Sensitive     GENTAMICIN <=1 SENSITIVE Sensitive     IMIPENEM <=0.25 SENSITIVE Sensitive     TRIMETH/SULFA <=20 SENSITIVE Sensitive     AMPICILLIN/SULBACTAM <=2 SENSITIVE Sensitive     PIP/TAZO <=4 SENSITIVE Sensitive     * ESCHERICHIA COLI    Radiology  Reports US Ob Comp Less 14 Wks  03/29/2015   CLINICAL DATA:  Positive urine pregnancy test.  EXAM: OBSTETRIC <14 WK ULTRASOUND  TECHNIQUE: Transabdominal ultrasound was performed for evaluation of the gestation as well as the maternal uterus and adnexal regions.  COMPARISON:  None.  FINDINGS: Intrauterine gestational sac: Visualized/normal in shape.  Yolk sac:  Yes  Embryo:  Yes  Cardiac Activity: Yes  Heart Rate: 178 bpm  CRL:   41.3  mm   11 w 0 d                  [place] EDC: 10/18/2015  Maternal uterus/adnexae: No uterine masses. No subchorionic hemorrhage. Cervix is closed. Normal ovaries. No adnexal masses. No free fluid.  IMPRESSION: 1. Single live intrauterine pregnancy with a measured gestational age of [redacted] weeks. 2. No emergent pregnancy or maternal findings.   Electronically Signed   By: Amie Portland M.D.   On: 03/29/2015 16:18   US Renal  03/29/2015   CLINICAL DATA:  LEFT flank pain for 3 days  EXAM: RENAL / URINARY TRACT ULTRASOUND COMPLETE  COMPARISON:  CT abdomen and pelvis 02/09/2014  FINDINGS: Right Kidney:  Length: 11.1 cm. Normal morphology without mass, hydronephrosis, or shadowing calcification.  Left Kidney:  Length: 11.4 cm. Normal cortical thickness and echogenicity. No mass, hydronephrosis or shadowing calcification.  Bladder:  Normal appearance.  IMPRESSION: Normal  renal ultrasound.   Electronically Signed   By: Ulyses Southward M.D.   On: 03/29/2015 16:25     CBC  Recent Labs Lab 03/30/15 0733  WBC 13.5*  HGB 8.8*  HCT 25.6*  PLT 133*  MCV 85.3  MCH 29.3  MCHC 34.4  RDW 13.8    Chemistries   Recent Labs Lab 03/29/15 2010 03/30/15 0733  NA  --  133*  K  --  3.6  CL  --  107  CO2  --  20*  GLUCOSE  --  99  BUN  --  <5*  CREATININE  --  0.43*  CALCIUM  --  8.0*  MG 1.3*  --    ------------------------------------------------------------------------------------------------------------------ estimated creatinine clearance is 91.9 mL/min (by C-G formula based on Cr of 0.43). ------------------------------------------------------------------------------------------------------------------ No results for input(s): HGBA1C in the last 72 hours. ------------------------------------------------------------------------------------------------------------------ No results for input(s): CHOL, HDL, LDLCALC, TRIG, CHOLHDL, LDLDIRECT in the last 72 hours. ------------------------------------------------------------------------------------------------------------------ No results for input(s): TSH, T4TOTAL, T3FREE, THYROIDAB in the last 72 hours.  Invalid input(s): FREET3 ------------------------------------------------------------------------------------------------------------------ No results for input(s): VITAMINB12, FOLATE, FERRITIN, TIBC, IRON, RETICCTPCT in the last 72 hours.  Coagulation profile No results for input(s): INR, PROTIME in the last 168 hours.  No results for input(s): DDIMER in the last 72 hours.  Cardiac Enzymes No results for input(s): CKMB, TROPONINI, MYOGLOBIN in the last 168 hours.  Invalid input(s): CK ------------------------------------------------------------------------------------------------------------------ Invalid input(s): POCBNP   Time Spent in minutes   35   Klarissa Mcilvain K M.D on 04/05/2015 at 6:00  PM  Between 7am to 7pm - Pager - [phone]  After 7pm go to [email or web address] - password [facility]  [facility] -  Office  [phone]

## 2016-03-10 ENCOUNTER — Encounter (HOSPITAL_COMMUNITY): Payer: Self-pay

## 2016-03-10 ENCOUNTER — Emergency Department (HOSPITAL_COMMUNITY)
Admission: EM | Admit: 2016-03-10 | Discharge: 2016-03-10 | Disposition: A | Discharge status: Home / Self Care | Payer: Medicaid Other

## 2016-03-10 NOTE — ED Triage Notes (Signed)
Pt reports dental pain X1 week. She had appt on Monday but the office was closed. She also reports bilateral ankle and leg swelling X2 days. Ankles visibly swollen.

## 2016-03-10 NOTE — ED Notes (Signed)
Pt states she doesn't want to stay any longer and states she has been waiting a long time without being seen by the doctor. Pt leaves without being seen by MD.

## 2016-08-19 MED FILL — BENZTROPINE MES 0.5 MG TAB: 0.5 | 30 days supply | Qty: 30 | Fill #0

## 2016-08-19 MED FILL — traZODone HCL 50 MG TABS: 50 | 30 days supply | Qty: 30 | Fill #0

## 2016-08-19 MED FILL — ARIPiprazole 10 MG TABS: 10 | 30 days supply | Qty: 30 | Fill #0

## 2016-08-20 MED FILL — clonazePAM 1 MG TABS: 1 | 30 days supply | Qty: 45 | Fill #0

## 2017-03-02 ENCOUNTER — Emergency Department (HOSPITAL_COMMUNITY): Payer: Medicaid Other

## 2017-03-02 ENCOUNTER — Emergency Department (HOSPITAL_COMMUNITY)
Admission: EM | Admit: 2017-03-02 | Discharge: 2017-03-02 | Disposition: A | Discharge status: Home / Self Care | Payer: Medicaid Other | Attending: Emergency Medicine | Admitting: Emergency Medicine

## 2017-03-02 ENCOUNTER — Encounter (HOSPITAL_COMMUNITY): Payer: Self-pay | Admitting: Emergency Medicine

## 2017-03-02 DIAGNOSIS — Y998 Other external cause status: Secondary | ICD-10-CM | POA: Insufficient documentation

## 2017-03-02 DIAGNOSIS — F1110 Opioid abuse, uncomplicated: Secondary | ICD-10-CM | POA: Diagnosis not present

## 2017-03-02 DIAGNOSIS — Y929 Unspecified place or not applicable: Secondary | ICD-10-CM | POA: Insufficient documentation

## 2017-03-02 DIAGNOSIS — M25521 Pain in right elbow: Secondary | ICD-10-CM

## 2017-03-02 DIAGNOSIS — Y9389 Activity, other specified: Secondary | ICD-10-CM | POA: Diagnosis not present

## 2017-03-02 DIAGNOSIS — F17200 Nicotine dependence, unspecified, uncomplicated: Secondary | ICD-10-CM | POA: Diagnosis not present

## 2017-03-02 DIAGNOSIS — S50311A Abrasion of right elbow, initial encounter: Secondary | ICD-10-CM | POA: Insufficient documentation

## 2017-03-02 DIAGNOSIS — Z79899 Other long term (current) drug therapy: Secondary | ICD-10-CM | POA: Insufficient documentation

## 2017-03-02 DIAGNOSIS — M25571 Pain in right ankle and joints of right foot: Secondary | ICD-10-CM | POA: Diagnosis not present

## 2017-03-02 DIAGNOSIS — W108XXA Fall (on) (from) other stairs and steps, initial encounter: Secondary | ICD-10-CM | POA: Insufficient documentation

## 2017-03-02 DIAGNOSIS — S59911A Unspecified injury of right forearm, initial encounter: Secondary | ICD-10-CM | POA: Diagnosis present

## 2017-03-02 DIAGNOSIS — T148XXA Other injury of unspecified body region, initial encounter: Secondary | ICD-10-CM

## 2017-03-02 LAB — POC URINE PREG, ED: PREG TEST UR: NEGATIVE

## 2017-03-02 MED ORDER — IBUPROFEN 200 MG PO TABS
600.0000 mg | ORAL_TABLET | Freq: Once | ORAL | Status: AC
  Administered 2017-03-02: 600 mg via ORAL
  Filled 2017-03-02: qty 1

## 2017-03-02 MED ORDER — BACITRACIN ZINC 500 UNIT/GM EX OINT
TOPICAL_OINTMENT | Freq: Two times a day (BID) | CUTANEOUS | Status: DC
  Administered 2017-03-02: 1 via TOPICAL
  Filled 2017-03-02: qty 0.9

## 2017-03-02 MED ORDER — ACETAMINOPHEN 325 MG PO TABS: 650.0000 mg | ORAL_TABLET | Freq: Once | ORAL | Status: DC

## 2017-03-02 NOTE — ED Triage Notes (Signed)
Pt presents with right arm pain. Started this am it is a constant aching pain. Pt did fall down stairs 2 days ago but doesn't think it is an associated symptom. NAD noted in triage

## 2017-03-02 NOTE — Discharge Instructions (Signed)
You can take Tylenol or Ibuprofen as directed for pain.  Follow the RICE (Rest, Ice, Compression, Elevation) protocol as directed.   Follow-up with the clinic or list of providers listed below for further evaluation.  Return the emergency Department for any worsening pain, redness/swelling of the arm or ankle, fever or chills, chest pain, difficulty walking or any other worsening or concerning symptoms.  If you do not have a primary care doctor you see regularly, please you the list below. Please call them to arrange for follow-up.    No Primary Care Doctor Call [facility]  [phone] Other agencies that provide inexpensive medical care    [facility]  [phone]    [facility]  [phone]    [facility]  [phone]    [facility]  [phone]    [facility]  [phone]    [facility]  [phone]

## 2017-03-02 NOTE — ED Notes (Signed)
Pt also c/o bilateral ankle pain and request xray of same . Will inform MD.

## 2017-03-02 NOTE — ED Notes (Signed)
Pt stats zshe understands instructions. Hiome stable with steady gait.

## 2017-03-02 NOTE — ED Notes (Signed)
PA aware of VS 

## 2017-03-02 NOTE — ED Notes (Signed)
Patient transported to X-ray 

## 2017-03-02 NOTE — ED Provider Notes (Signed)
[facility]ENCY DEPT Provider Note   CSN: [id number] Arrival date & time: 03/02/17  4098     History   Chief Complaint Chief Complaint  Patient presents with  . Arm Pain    HPI Sandra Guzman is a 26 y.o. female who presents with right elbow pain that started this morning. Patient describes it as a constant ache. She reports that she did fall down a flight of stairs 2 days ago and landed on her right arm. Patient reports that she scraped her head in the incident. She states she is able to recall the entire event but states that she may have blacked out for seconds afterwards. She has not had any vomiting or headache after the incident. Patient is not on any blood thinners. She reports that initially she had been doing fine after the accident but states that symptoms started this morning. She denies any new falls or trauma. She has not taken any medication for the pain. Her pain is worsened with movement of the right upper extremity. Patient also reports that she has a abrasion to the right elbow. Patient denies any numbness/weakness of the arm.  The history is provided by the patient.    History reviewed. No pertinent past medical history.  Patient Active Problem List   Diagnosis Date Noted  . Heroin abuse 03/29/2015  . Pregnancy 03/29/2015  . Sepsis (HCC) 03/29/2015  . UTI (urinary tract infection) 03/29/2015  . Pyelonephritis 03/29/2015    Past Surgical History:  Procedure Laterality Date  . CESAREAN SECTION    . DENTAL SURGERY      OB History    Gravida Para Term Preterm AB Living   1             SAB TAB Ectopic Multiple Live Births                   Home Medications    Prior to Admission medications   Medication Sig Start Date End Date Taking? Authorizing Provider  acetaminophen (TYLENOL) 500 MG tablet Take 2,000 mg by mouth every 6 (six) hours as needed for mild pain.    [provider]  cefpodoxime (VANTIN) 200 MG tablet Take 1 tablet (200 mg total) by  mouth 2 (two) times daily. 03/30/15   Leroy Sea, MD  diphenhydrAMINE (BENADRYL) 25 MG tablet Take 50 mg by mouth every 6 (six) hours as needed for allergies.    [provider]  methadone (DOLOPHINE) 10 MG tablet Take 65 mg by mouth daily.    [provider]    Family History No family history on file.  Social History Social History  Substance Use Topics  . Smoking status: Current Every Day Smoker    Packs/day: 0.50  . Smokeless tobacco: Never Used  . Alcohol use No     Allergies   Patient has no known allergies.   Review of Systems Review of Systems  Musculoskeletal:       Right elbow pain  Neurological: Negative for weakness and numbness.     Physical Exam Updated Vital Signs BP 94/66 (BP Location: Right Arm)   Pulse (!) 54   Temp 97.6 F (36.4 C) (Oral)   Resp 18   Ht 5\' 1"  (1.549 m)   Wt 77.1 kg (170 lb)   LMP 02/15/2016 Comment: states no being pregnant  SpO2 96%   Breastfeeding? Unknown   BMI 32.12 kg/m   Physical Exam  Constitutional: She is oriented to person, place, and  time. She appears well-developed and well-nourished.  Sitting comfortably on examination table  HENT:  Head: Normocephalic and atraumatic. Head is without raccoon's eyes and without Battle's sign.  Small abrasion to the right forehead. No skull deformity or crepitus noted. No evidence of raccoon's or Battle signs. No ecchymosis or edema to the forehead.  Eyes: Conjunctivae and EOM are normal. Right eye exhibits no discharge. Left eye exhibits no discharge. No scleral icterus.  Cardiovascular: Normal pulses.   Pulses:      Radial pulses are 2+ on the right side, and 2+ on the left side.       Dorsalis pedis pulses are 2+ on the right side, and 2+ on the left side.  Pulmonary/Chest: Effort normal.  Musculoskeletal:  No tenderness palpation bilateral shoulders or clavicles. No deformities or crepitus noted. Full range of motion of left upper extremity. Tenderness  palpation to the anterior aspect of the right elbow. Superficial abrasion noted to the posterior aspect of the right elbow. Flexion/extension of elbow intact without difficulty. No tenderness palpation to the right wrist. No snuffbox tenderness. Full range of motion of the right wrist in all 5 digits of the right hand. Full abduction/adduction and opposition of thumb intact. Mild tenderness palpation to the right ankle. No overlying ecchymosis or edema or erythema. No deformity or crepitus noted. Full range of motion of right ankle without difficulty.  Neurological: She is alert and oriented to person, place, and time. GCS eye subscore is 4. GCS verbal subscore is 5. GCS motor subscore is 6.  Sensation intact all major nerve distributions of the hand. Follows commands. His all extremities without difficulty. Dorsiflexion and plantarflexion of bilateral ankles intact without difficulty. Cranial nerves III-XII intact Follows commands, Moves all extremities  5/5 strength to BUE and BLE  Sensation intact throughout  Normal finger to nose. No dysdiadochokinesia. No pronator drift. No slurred speech. No facial droop.   Skin: Skin is warm and dry.  Skin abrasion noted to the posterior aspect of the right elbow in the anterior aspect of the right knee. No overlying warmth, erythema, edema or ecchymosis of the right extremity.  Psychiatric: She has a normal mood and affect. Her speech is normal and behavior is normal.  Nursing note and vitals reviewed.    ED Treatments / Results  Labs (all labs ordered are listed, but only abnormal results are displayed) Labs Reviewed  POC URINE PREG, ED    EKG  EKG Interpretation None       Radiology Dg Elbow Complete Right  Result Date: 03/02/2017 CLINICAL DATA:  Fall. EXAM: RIGHT ELBOW - COMPLETE 3+ VIEW COMPARISON:  No recent prior . FINDINGS: No acute soft tissue bony abnormality identified. No evidence of fracture dislocation. IMPRESSION: No acute  abnormality. Electronically Signed   By: Maisie Fushomas  Register   On: 03/02/2017 08:01   Dg Forearm Right  Result Date: 03/02/2017 CLINICAL DATA:  Larey SeatFell down stairs 2 days ago EXAM: RIGHT FOREARM - 2 VIEW COMPARISON:  None. FINDINGS: There is no evidence of fracture or other focal bone lesions. Soft tissues are unremarkable. IMPRESSION: Negative. Electronically Signed   By: Marlan Palauharles  Clark M.D.   On: 03/02/2017 08:12   Dg Ankle Complete Right  Result Date: 03/02/2017 CLINICAL DATA:  Larey SeatFell down steps 2 days ago EXAM: RIGHT ANKLE - COMPLETE 3+ VIEW COMPARISON:  05/27/2007 FINDINGS: There is no evidence of fracture, dislocation, or joint effusion. There is no evidence of arthropathy or other focal bone abnormality. Soft tissues are  unremarkable. IMPRESSION: Negative. Electronically Signed   By: Marlan Palau M.D.   On: 03/02/2017 08:02    Procedures Procedures (including critical care time)  Medications Ordered in ED Medications  bacitracin ointment (not administered)  ibuprofen (ADVIL,MOTRIN) tablet 600 mg (600 mg Oral Given 03/02/17 0831)     Initial Impression / Assessment and Plan / ED Course  I have reviewed the triage vital signs and the nursing notes.  Pertinent labs & imaging results that were available during my care of the patient were reviewed by me and considered in my medical decision making (see chart for details).     26 year old female who presents with right elbow pain that began this morning. History of fall down  stairs 2 days ago. With elbow pain this morning. Consider sprain versus muscular pain versus fracture versus dislocation. Will obtain x-ray of right elbow and right forearm for evaluation. No neuro deficits on exam. Given reassuring physical exam and per [facility] CT criteria, no imaging is indicated at this time.  Analgesics provided in the department.  Nurse informed provider that patient was requesting imaging of her ankles since they hurt also. She states her  right is worse than her left. Reevaluation of ankle show that she is minimally tender to the right ankle. No evidence of deformity or crepitus. No evidence of ecchymosis, erythema, edema. Patient is requesting a x-ray to evaluate for any potential problems. Low suspicion for acute fracture or dislocation but will obtain right ankle x-ray,    X-rays reviewed. Negative for any acute fracture or dislocation of the ankle, forearm, elbow. Discussed results with patient. Will plan to provide bacitracin and an sterile dressing to the abrasions on her right elbow and right knee. She does have a primary care doctor that she follows up with. Checked it her that she'll need to follow up with her primary care doctor for further evaluation. Patient is requesting Tylenol III for pain. Instructed patient that her symptoms were treated with regular NSAIDs or Tylenol. We'll apply bacitracin and sterile dressing to her abrasions on her elbow and knee.  Nurse notified provider note the patient's blood pressure was slightly low. Patient reports that she took Suboxone prior to coming to the emergency department today. We will repeat vitals.  Repeat blood pressure is 94/66. Exam bleeding in the room without any difficulty. Provided patient with a list of clinic resources to use if he does not have a PCP. Instructed to call them today to arrange follow-up in the next 24-48 hours. Return precautions discussed. Patient expresses understanding and agreement to plan.    Final Clinical Impressions(s) / ED Diagnoses   Final diagnoses:  Abrasion  Right elbow pain  Acute right ankle pain    New Prescriptions New Prescriptions   No medications on file     Rosana Hoes 03/02/17 1010    Lavera Guise, MD 03/02/17 [phone]

## 2017-03-07 ENCOUNTER — Emergency Department (HOSPITAL_COMMUNITY)
Admission: EM | Admit: 2017-03-07 | Discharge: 2017-03-07 | Disposition: A | Discharge status: Home / Self Care | Payer: Medicaid Other | Attending: Emergency Medicine | Admitting: Emergency Medicine

## 2017-03-07 ENCOUNTER — Encounter (HOSPITAL_COMMUNITY): Payer: Self-pay | Admitting: Emergency Medicine

## 2017-03-07 DIAGNOSIS — S93401D Sprain of unspecified ligament of right ankle, subsequent encounter: Secondary | ICD-10-CM | POA: Diagnosis not present

## 2017-03-07 DIAGNOSIS — F17210 Nicotine dependence, cigarettes, uncomplicated: Secondary | ICD-10-CM | POA: Insufficient documentation

## 2017-03-07 DIAGNOSIS — Y929 Unspecified place or not applicable: Secondary | ICD-10-CM | POA: Diagnosis not present

## 2017-03-07 DIAGNOSIS — W108XXA Fall (on) (from) other stairs and steps, initial encounter: Secondary | ICD-10-CM | POA: Diagnosis not present

## 2017-03-07 DIAGNOSIS — Y9389 Activity, other specified: Secondary | ICD-10-CM | POA: Diagnosis not present

## 2017-03-07 DIAGNOSIS — Z79899 Other long term (current) drug therapy: Secondary | ICD-10-CM | POA: Diagnosis not present

## 2017-03-07 DIAGNOSIS — S99911A Unspecified injury of right ankle, initial encounter: Secondary | ICD-10-CM | POA: Diagnosis present

## 2017-03-07 DIAGNOSIS — Y999 Unspecified external cause status: Secondary | ICD-10-CM | POA: Insufficient documentation

## 2017-03-07 MED ORDER — IBUPROFEN 800 MG PO TABS: 800.0000 mg | ORAL_TABLET | Freq: Three times a day (TID) | ORAL | 0 refills | Status: DC

## 2017-03-07 MED ORDER — HYDROCODONE-ACETAMINOPHEN 5-325 MG PO TABS
1.0000 | ORAL_TABLET | Freq: Once | ORAL | Status: AC
  Administered 2017-03-07: 1 via ORAL
  Filled 2017-03-07: qty 1

## 2017-03-07 NOTE — ED Provider Notes (Signed)
[facility]-EMERGENCY DEPT Provider Note   CSN: [id number] Arrival date & time: 03/07/17  1944  By signing my name below, I, Sandra Guzman, attest that this documentation has been prepared under the direction and in the presence of Terance Hart, PA-C. Electronically Signed: Linna Guzman, Scribe. 03/07/2017. 9:20 PM.  History   Chief Complaint Chief Complaint  Patient presents with  . Ankle Pain   The history is provided by the patient. No language interpreter was used.    HPI Comments: Sandra Guzman is a 26 y.o. female who presents to the Emergency Department complaining of constant right ankle pain and swelling since a fall on 7/17. Patient sustained a mechanical down a few steps and injured her right ankle without head trauma or LOC. She was evaluated here immediately thereafter and imaging of the ankle was negative. Patient was treated for a right ankle sprain and was given Tylenol in the ED without relief. She has been resting as well as icing/elevating the ankle without improvement. Patient has not tried any OTC pain medications or worn an ankle brace. She is able to ambulate. She notes that she has chronic pain in her right ankle due to a prior fracture but her pain since the fall is acutely worse. Patient is currently homeless and cannot afford prescription medications. She denies numbness/tingling, bruising, open wounds, or any other associated symptoms.  She is secondarily complaining of a persistent rash underneath her breasts for several days. Patient states the area is often sweaty and the skin underneath her breasts has become irritated. There are no alleviating factors noted and she has not tried any treatments for the rash. No fevers or chills. She has no additional acute complaints or concerns at this time.   History reviewed. No pertinent past medical history.  Patient Active Problem List   Diagnosis Date Noted  . Heroin abuse 03/29/2015  . Pregnancy 03/29/2015  . Sepsis (HCC)  03/29/2015  . UTI (urinary tract infection) 03/29/2015  . Pyelonephritis 03/29/2015    Past Surgical History:  Procedure Laterality Date  . CESAREAN SECTION    . DENTAL SURGERY      OB History    Gravida Para Term Preterm AB Living   1             SAB TAB Ectopic Multiple Live Births                   Home Medications    Prior to Admission medications   Medication Sig Start Date End Date Taking? Authorizing Provider  acetaminophen (TYLENOL) 500 MG tablet Take 2,000 mg by mouth every 6 (six) hours as needed for mild pain.    [provider]  cefpodoxime (VANTIN) 200 MG tablet Take 1 tablet (200 mg total) by mouth 2 (two) times daily. 03/30/15   Leroy Sea, MD  diphenhydrAMINE (BENADRYL) 25 MG tablet Take 50 mg by mouth every 6 (six) hours as needed for allergies.    [provider]  ibuprofen (ADVIL,MOTRIN) 800 MG tablet Take 1 tablet (800 mg total) by mouth 3 (three) times daily. 03/07/17   Bethel Born, PA-C  methadone (DOLOPHINE) 10 MG tablet Take 65 mg by mouth daily.    [provider]    Family History No family history on file.  Social History Social History  Substance Use Topics  . Smoking status: Current Every Day Smoker    Packs/day: 0.50  . Smokeless tobacco: Never Used  . Alcohol use No  Allergies   Patient has no known allergies.   Review of Systems Review of Systems  Constitutional: Negative for chills and fever.  Musculoskeletal: Positive for arthralgias and joint swelling.  Skin: Positive for rash. Negative for color change and wound.  Neurological: Negative for numbness.   Physical Exam Updated Vital Signs BP 131/77   Pulse 67   Temp 98.6 F (37 C) (Oral)   Resp 16   Ht 5' (1.524 m)   Wt 170 lb (77.1 kg)   LMP 02/14/2017 (Exact Date)   SpO2 99%   BMI 33.20 kg/m   Physical Exam  Constitutional: She is oriented to person, place, and time. She appears well-developed and well-nourished. No  distress.  HENT:  Head: Normocephalic and atraumatic.  Eyes: Conjunctivae and EOM are normal.  Neck: Neck supple. No tracheal deviation present.  Cardiovascular: Normal rate.   Pulmonary/Chest: Effort normal. No respiratory distress.  Musculoskeletal: Normal range of motion.  Diffuse swelling of right ankle extending up to distal shin. Neurovascularly intact. Ambulatory.  Neurological: She is alert and oriented to person, place, and time.  Skin: Skin is warm and dry.  Irritated skin underneath the breasts with erythema underneath breast tissue.  Psychiatric: She has a normal mood and affect. Her behavior is normal.  Nursing note and vitals reviewed.  ED Treatments / Results  Labs (all labs ordered are listed, but only abnormal results are displayed) Labs Reviewed - No data to display  EKG  EKG Interpretation None       Radiology No results found.  Procedures Procedures (including critical care time)  DIAGNOSTIC STUDIES: Oxygen Saturation is 99% on RA, normal by my interpretation.    COORDINATION OF CARE: 9:13 PM Discussed treatment plan with pt at bedside and pt agreed to plan.  Medications Ordered in ED Medications  HYDROcodone-acetaminophen (NORCO/VICODIN) 5-325 MG per tablet 1 tablet (1 tablet Oral Given 03/07/17 2146)     Initial Impression / Assessment and Plan / ED Course  I have reviewed the triage vital signs and the nursing notes.  Pertinent labs & imaging results that were available during my care of the patient were reviewed by me and considered in my medical decision making (see chart for details).  26 year old female presents with acute on chronic pain due to ankle sprain. I agreed to one dose of Norco in the ED but advised I would not send her home with an rx for narcotics. She did agree to Ibuprofen 800mg . Also is willing to try ASO brace. For the rash underneath her breath, it looks irritated. Advised using deoderant in the area and supportive bras and  to keep area dry. She was given something to eat and drink and was discharged.  Final Clinical Impressions(s) / ED Diagnoses   Final diagnoses:  Sprain of right ankle, subsequent encounter    New Prescriptions New Prescriptions   IBUPROFEN (ADVIL,MOTRIN) 800 MG TABLET    Take 1 tablet (800 mg total) by mouth 3 (three) times daily.   I personally performed the services described in this documentation, which was scribed in my presence. The recorded information has been reviewed and is accurate.    Bethel BornGekas, Ahmir Bracken Marie, PA-C 03/08/17 Philippa Chester0019    Jerelyn ScottLinker, Martha, MD 03/10/17 [phone]

## 2017-03-07 NOTE — Discharge Instructions (Signed)
Rest - please stay off ankle as much as possible °Ice - ice for 20 minutes at a time, several times a day °Compression - wear brace to provide support °Elevate - elevate ankle above level of heart °Ibuprofen - take with food. Take up to 3-4 times daily ° °

## 2017-03-07 NOTE — Progress Notes (Signed)
Orthopedic Tech Progress Note Patient Details:  Sandra Guzman 1990/09/22 [id number]  Ortho Devices Type of Ortho Device: ASO Ortho Device/Splint Location: RLE Ortho Device/Splint Interventions: Ordered, Application   Jennye MoccasinHughes, Lourene Hoston Craig 03/07/2017, 10:16 PM

## 2017-03-07 NOTE — ED Triage Notes (Signed)
Pt reports R ankle pain. Was seen here 7/17 for same, had xrays done, everything came back negative. Pt specifically requesting narcotics in triage, she does not want tylenol or motrin. According to notes pt requested tylenol III specifically with Dr. Verdie MosherLiu on 7/17.

## 2017-03-22 ENCOUNTER — Emergency Department (HOSPITAL_COMMUNITY)
Admission: EM | Admit: 2017-03-22 | Discharge: 2017-03-22 | Disposition: A | Discharge status: Home / Self Care | Payer: Medicaid Other | Attending: Emergency Medicine | Admitting: Emergency Medicine

## 2017-03-22 ENCOUNTER — Encounter (HOSPITAL_COMMUNITY): Payer: Self-pay | Admitting: Emergency Medicine

## 2017-03-22 DIAGNOSIS — G4700 Insomnia, unspecified: Secondary | ICD-10-CM | POA: Insufficient documentation

## 2017-03-22 DIAGNOSIS — R42 Dizziness and giddiness: Secondary | ICD-10-CM | POA: Diagnosis not present

## 2017-03-22 DIAGNOSIS — R110 Nausea: Secondary | ICD-10-CM | POA: Diagnosis present

## 2017-03-22 DIAGNOSIS — F17210 Nicotine dependence, cigarettes, uncomplicated: Secondary | ICD-10-CM | POA: Diagnosis not present

## 2017-03-22 MED ORDER — ONDANSETRON HCL 4 MG PO TABS: 4.0000 mg | ORAL_TABLET | Freq: Four times a day (QID) | ORAL | 0 refills | Status: DC

## 2017-03-22 NOTE — ED Provider Notes (Signed)
[facility]-EMERGENCY DEPT Provider Note   CSN: [id number] Arrival date & time: 03/22/17  0243     History   Chief Complaint Chief Complaint  Patient presents with  . Detox    HPI Sandra Guzman is a 26 y.o. female.  HPI   63 show female presents today with request for methadone detox. Patient notes that she has been on methadone for 2 years, she stopped last Tuesday. She was seen at [facility] and admitted for 1 day. She notes they discharged her after the one day. She noted she still having some insomnia and nausea and generalized "bone pain". She denies any infectious etiology, she denies any vomiting, or any other significant etiology. Patient reports she attempted to go to [facility] and was refused.  History reviewed. No pertinent past medical history.  Patient Active Problem List   Diagnosis Date Noted  . Heroin abuse 03/29/2015  . Pregnancy 03/29/2015  . Sepsis (HCC) 03/29/2015  . UTI (urinary tract infection) 03/29/2015  . Pyelonephritis 03/29/2015    Past Surgical History:  Procedure Laterality Date  . CESAREAN SECTION    . DENTAL SURGERY      OB History    Gravida Para Term Preterm AB Living   1             SAB TAB Ectopic Multiple Live Births                   Home Medications    Prior to Admission medications   Medication Sig Start Date End Date Taking? Authorizing Provider  acetaminophen (TYLENOL) 500 MG tablet Take 2,000 mg by mouth every 6 (six) hours as needed for mild pain.    [provider]  cefpodoxime (VANTIN) 200 MG tablet Take 1 tablet (200 mg total) by mouth 2 (two) times daily. 03/30/15   Leroy Sea, MD  diphenhydrAMINE (BENADRYL) 25 MG tablet Take 50 mg by mouth every 6 (six) hours as needed for allergies.    [provider]  ibuprofen (ADVIL,MOTRIN) 800 MG tablet Take 1 tablet (800 mg total) by mouth 3 (three) times daily. 03/07/17   Bethel Born, PA-C  methadone (DOLOPHINE) 10 MG tablet Take 65 mg by mouth  daily.    [provider]  ondansetron (ZOFRAN) 4 MG tablet Take 1 tablet (4 mg total) by mouth every 6 (six) hours. 03/22/17   Eyvonne Mechanic, PA-C    Family History History reviewed. No pertinent family history.  Social History Social History  Substance Use Topics  . Smoking status: Current Every Day Smoker    Packs/day: 1.00    Types: Cigarettes  . Smokeless tobacco: Never Used  . Alcohol use No     Allergies   Patient has no known allergies.   Review of Systems Review of Systems  All other systems reviewed and are negative.    Physical Exam Updated Vital Signs BP 103/76 (BP Location: Right Arm)   Pulse (!) 101   Temp 98.3 F (36.8 C) (Oral)   Resp 18   Ht 5' (1.524 m)   Wt 76.2 kg (168 lb)   LMP 03/05/2017   SpO2 98%   BMI 32.81 kg/m   Physical Exam  Constitutional: She is oriented to person, place, and time. She appears well-developed and well-nourished.  HENT:  Head: Normocephalic and atraumatic.  Eyes: Pupils are equal, round, and reactive to light. Conjunctivae are normal. Right eye exhibits no discharge. Left eye exhibits no discharge. No scleral  icterus.  Neck: Normal range of motion. No JVD present. No tracheal deviation present.  Cardiovascular: Regular rhythm and normal heart sounds.   Pulmonary/Chest: Effort normal. No stridor. No respiratory distress. She has no wheezes. She has no rales. She exhibits no tenderness.  Neurological: She is alert and oriented to person, place, and time. Coordination normal.  Psychiatric: She has a normal mood and affect. Her behavior is normal. Judgment and thought content normal.  Nursing note and vitals reviewed.    ED Treatments / Results  Labs (all labs ordered are listed, but only abnormal results are displayed) Labs Reviewed - No data to display  EKG  EKG Interpretation None       Radiology No results found.  Procedures Procedures (including critical care time)  Medications Ordered in  ED Medications - No data to display   Initial Impression / Assessment and Plan / ED Course  I have reviewed the triage vital signs and the nursing notes.  Pertinent labs & imaging results that were available during my care of the patient were reviewed by me and considered in my medical decision making (see chart for details).      Final Clinical Impressions(s) / ED Diagnoses   Final diagnoses:  Nausea    26 year old female presents today for methadone detox. Patient has not had methadone in 5 days. She is displaying no significant signs of withdrawal. She is well appearing in no acute distress. No need for management in the ED setting. She will be discharged home with outpatient substance abuse resources. Return precautions given. She verbalized understanding and agreement to today's plan.  New Prescriptions Discharge Medication List as of 03/22/2017  3:56 AM       Eyvonne MechanicHedges, Keshan Reha, PA-C 03/22/17 0447    Wilkie AyeHorton, Mayer Maskerourtney F, MD 03/22/17 [phone]

## 2017-03-22 NOTE — ED Notes (Signed)
Bed: WTR5 Expected date:  Expected time:  Means of arrival:  Comments: 

## 2017-03-22 NOTE — Discharge Instructions (Signed)
Please read attached information. If you experience any new or worsening signs or symptoms please return to the emergency room for evaluation. Please follow-up with substance abuse counseling resources attached.

## 2017-03-22 NOTE — ED Triage Notes (Signed)
Pt reports that she has stopped using Methadone on 03/16/17 and wants detox. Pt stated that [facility] would not accept her Medicaid. Pt denies any SI/HI

## 2017-03-22 NOTE — ED Notes (Signed)
Pt ambulated out of department prior to receiving discharge information.

## 2018-10-28 IMAGING — DX DG FOREARM 2V*R*
2 series · 2 of 2 positions shown · non-contrast
Comparison: None.

CLINICAL DATA: Fell down stairs 2 days ago

EXAM:
RIGHT FOREARM - 2 VIEW

[forearm ap]
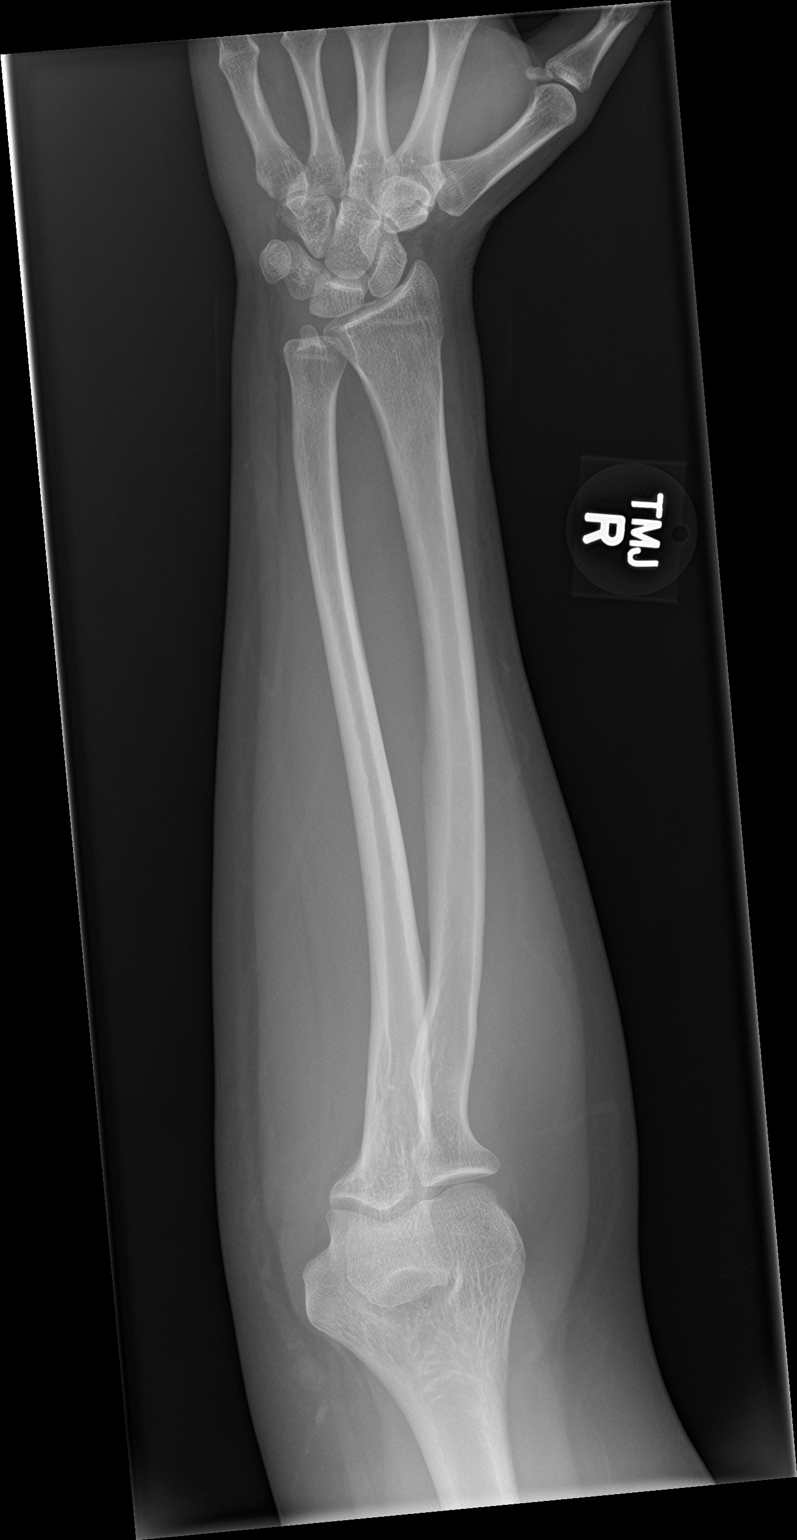

[forearm lat]
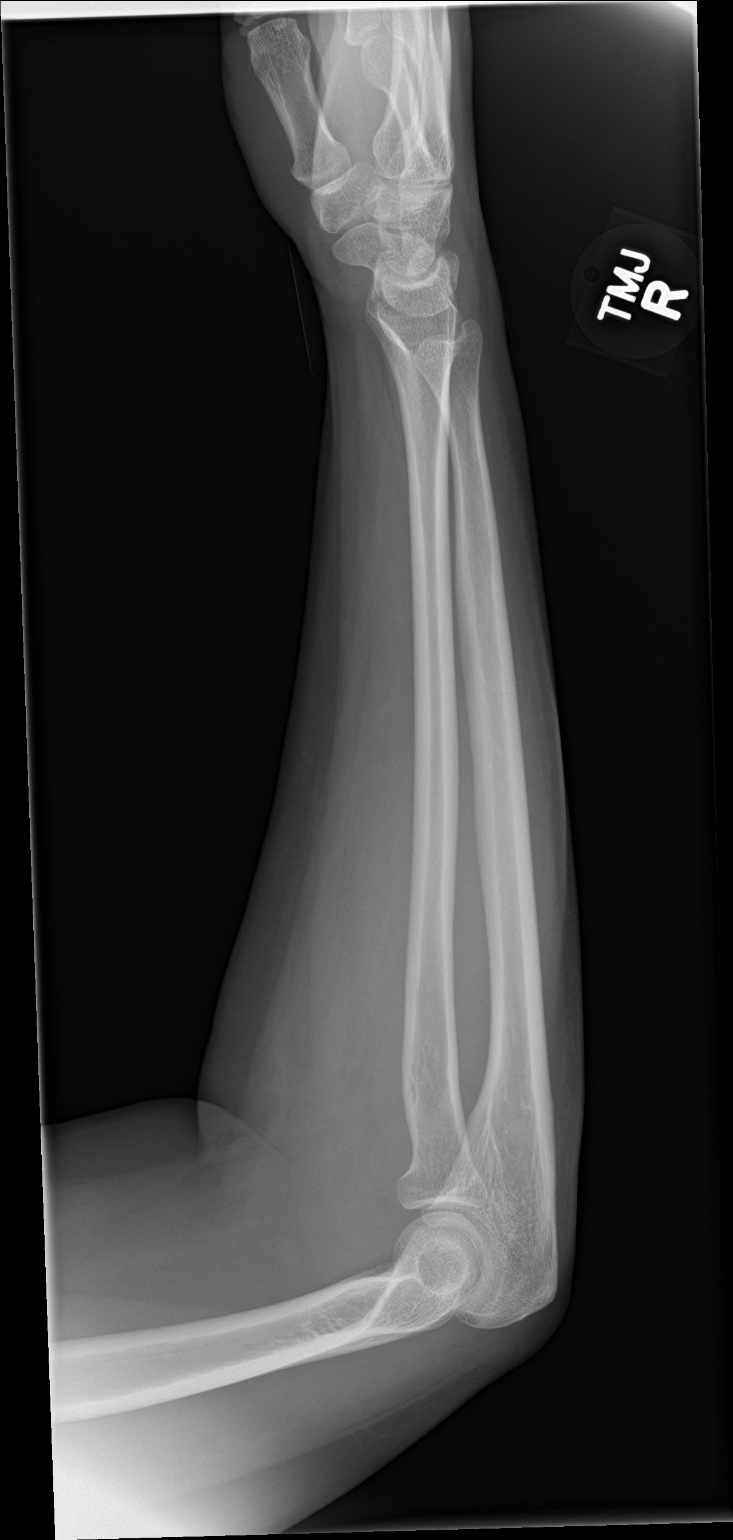

[2 of 2 positions shown; findings below may reference images not displayed]

FINDINGS: There is no evidence of fracture or other focal bone lesions. Soft
tissues are unremarkable.
IMPRESSION: Negative.

## 2019-01-13 ENCOUNTER — Ambulatory Visit: Payer: Self-pay | Admitting: Orthopaedic Surgery

## 2019-01-23 ENCOUNTER — Telehealth: Payer: Self-pay | Admitting: Orthopaedic Surgery

## 2019-01-23 NOTE — Telephone Encounter (Signed)
Called patient LMOM to advse we got VM advised had appt w/[facility]-advised to call us back to make another appt [phone]

## 2019-02-02 ENCOUNTER — Ambulatory Visit: Payer: Medicaid Other | Attending: Physician Assistant

## 2019-02-02 ENCOUNTER — Other Ambulatory Visit: Payer: Self-pay

## 2019-02-02 DIAGNOSIS — R293 Abnormal posture: Secondary | ICD-10-CM | POA: Diagnosis present

## 2019-02-02 DIAGNOSIS — M542 Cervicalgia: Secondary | ICD-10-CM | POA: Diagnosis present

## 2019-02-02 DIAGNOSIS — G8929 Other chronic pain: Secondary | ICD-10-CM | POA: Insufficient documentation

## 2019-02-02 DIAGNOSIS — M2560 Stiffness of unspecified joint, not elsewhere classified: Secondary | ICD-10-CM | POA: Diagnosis present

## 2019-02-02 DIAGNOSIS — M545 Low back pain: Secondary | ICD-10-CM | POA: Diagnosis present

## 2019-02-02 DIAGNOSIS — M4180 Other forms of scoliosis, site unspecified: Secondary | ICD-10-CM | POA: Diagnosis present

## 2019-02-02 NOTE — Therapy (Signed)
[facility] [address] [place], [place], [address] Phone: [phone]   Fax:  [phone]  Physical Therapy Evaluation  Patient Details  Name: Sandra Guzman MRN: [id number] Date of Birth: September 30, 1990 Referring Provider (PT): Luciano CutterMelissa Bujanowski, [place]-C   Encounter Date: 02/02/2019  PT End of Session - 02/02/19 0750    Visit Number  1    Number of Visits  12    Date for PT Re-Evaluation  03/24/19    Authorization Type  MCD    PT Start Time  0705    PT Stop Time  0748    PT Time Calculation (min)  43 min    Activity Tolerance  Patient tolerated treatment well    Behavior During Therapy  [facility] for tasks assessed/performed       History reviewed. No pertinent past medical history.  Past Surgical History:  Procedure Laterality Date  . CESAREAN SECTION    . DENTAL SURGERY      There were no vitals filed for this visit.   Subjective Assessment - 02/02/19 0711    Subjective  She reports scoliosis with neck and back pain.   Onset of  pain years ago with onand off pain that has been worse  06/2018.  She will see ortho MD.    Limitations  House hold activities   Bending, sitting,   How long can you sit comfortably?  20 min max    How long can you walk comfortably?  45 min max    Diagnostic tests  Xray: scoliosis    Patient Stated Goals  Try to decr pain and incr strength    Currently in Pain?  Yes    Pain Score  9     Pain Location  Neck    Pain Orientation  Posterior;Right;Left    Pain Descriptors / Indicators  --   horrible , debilitating   Pain Type  Chronic pain    Pain Onset  More than a month ago    Pain Frequency  Constant    Aggravating Factors   sitting , home tasks, bending, stand erect    Pain Relieving Factors  leaning,   lying , meds    Multiple Pain Sites  Yes    Pain Score  3    Pain Location  Back    Pain Orientation  Lower;Right;Left    Pain Descriptors / Indicators  Discomfort   painful   Pain Type   Chronic pain    Pain Onset  More than a month ago    Pain Frequency  Constant    Aggravating Factors   bending, home tasks bath child    Pain Relieving Factors  lye , meds         [facility] PT Assessment - 02/02/19 0001      Assessment   Referring Provider (PT)  Luciano CutterMelissa Bujanowski, PA-C    Onset Date/Surgical Date  --   years ago worse 06/2018   Next MD Visit  As needed    Prior Therapy  No      Precautions   Precautions  None      Restrictions   Weight Bearing Restrictions  No      Balance Screen   Has the patient fallen in the past 6 months  No      Prior Function   Level of Independence  Needs assistance with homemaking    Vocation  Unemployed      Cognition   Overall  Cognitive Status  Within Functional Limits for tasks assessed      Posture/Postural Control   Posture Comments  Grossly WNL but shoulders slighly elevated bilaterally      ROM / Strength   AROM / PROM / Strength  AROM;PROM;Strength      AROM   AROM Assessment Site  Lumbar;Cervical    Cervical Flexion  65    Cervical Extension  45    Cervical - Right Side Bend  32    Cervical - Left Side Bend  30    Lumbar Flexion  85    Lumbar Extension  20    Lumbar - Right Side Bend  15    Lumbar - Left Side Bend  15    Lumbar - Right Rotation  52    Lumbar - Left Rotation  50      PROM   Overall PROM Comments  125 degrees hip flexion      Strength   Overall Strength Comments  Normal UE strength, abdominal strength good.       Flexibility   Soft Tissue Assessment /Muscle Length  yes    Hamstrings  70 degrees bilaterally                Objective measurements completed on examination: See above findings.              PT Education - 02/02/19 0738    Education Details  POC HEP    Person(s) Educated  Patient    Methods  Explanation;Demonstration;Tactile cues;Verbal cues;Handout    Comprehension  Verbalized understanding;Returned demonstration       PT Short Term Goals - 02/02/19  0706      PT SHORT TERM GOAL #1   Title  She will be indpendnet with initial HEP    Baseline  No program    Period  Weeks    Status  New      PT SHORT TERM GOAL #2   Title  She will report pain in neck decr 10% or more    Baseline  9/10 pain    Time  2    Period  Weeks    Status  New      PT SHORT TERM GOAL #3   Title  back pain will decr 10% or more    Baseline  8/10 at eval    Time  2    Period  Weeks    Status  New        PT Long Term Goals - 02/02/19 0707      PT LONG TERM GOAL #1   Title  She will be indpendent with all hEP issued    Baseline  independent with initial HEP    Time  6    Period  Weeks    Status  New      PT LONG TERM GOAL #2   Title  She will report neck and back pain as intermittant    Baseline  constant at eval    Time  6    Period  Weeks    Status  New      PT LONG TERM GOAL #3   Title  She will report greater ease with bathing child    Baseline  almost cannot bath child in tub due to pain    Time  6    Period  Weeks    Status  New      PT LONG TERM GOAL #4  Title  She will be ABLE TO DO HER NORMAL SHOPPING WITHOUT INCREASED PAIN    Baseline  30 min max then pain incr    Time  6    Period  Weeks      PT LONG TERM GOAL #5   Title  Neck ROm with be WNL with min pain    Baseline  decr ROM rotation and sid e bending    Time  6    Period  Weeks    Status  New             Plan - 02/02/19 0751    Clinical Impression Statement  [name] presents with reports of neck and back pain. She demo abnormal posture, tenderness in neck and back to palpation , stiffness of neck and back  limiting tolerance to normal home tasks.. . Good overall strength.    Personal Factors and Comorbidities  Past/Current Experience;Time since onset of injury/illness/exacerbation    Examination-Activity Limitations  Bathing;Bend;Caring for Others;Carry;Stand    Examination-Participation Restrictions  Cleaning;Community Activity;Laundry     Stability/Clinical Decision Making  Stable/Uncomplicated    Clinical Decision Making  Low    Rehab Potential  Good    PT Frequency  --   3 visits   PT Duration  2 weeks   then 2x/week for 4 weeks   PT Treatment/Interventions  Electrical Stimulation;Moist Heat;Ultrasound;Traction;Therapeutic exercise;Therapeutic activities;Patient/family education;Dry needling;Passive range of motion;Manual techniques;Taping    PT Next Visit Plan  review and add to HEP . MAnual and modalities    PT Home Exercise Plan  neck levator and sidebend strethcing , cat/camel,  childs pose with side bend    Consulted and Agree with Plan of Care  Patient       Patient will benefit from skilled therapeutic intervention in order to improve the following deficits and impairments:  Pain, Postural dysfunction, Decreased activity tolerance, Increased muscle spasms, Decreased range of motion  Visit Diagnosis: 1. Levoscoliosis   2. Abnormal posture   3. Joint stiffness of spine   4. Cervicalgia   5. Chronic bilateral low back pain without sciatica        Problem List Patient Active Problem List   Diagnosis Date Noted  . Heroin abuse ([place]) 03/29/2015  . Pregnancy 03/29/2015  . Sepsis ([place]) 03/29/2015  . UTI (urinary tract infection) 03/29/2015  . Pyelonephritis 03/29/2015    Darrel Hoover PT 02/02/2019, 8:01 AM  [facility] [address] [place], [place], [address] Phone: [phone]   Fax:  [phone]  Name: Sandra Guzman MRN: [id number] Date of Birth: 12/11/1990

## 2019-02-02 NOTE — Patient Instructions (Signed)
Cat camel, childs pose  With side bending 2-3 reps 10-30 sec 2-3x/day  Neck side bend and levator stretch 10-30 sec 3x/day

## 2019-02-07 ENCOUNTER — Encounter: Payer: Self-pay | Admitting: Orthopaedic Surgery

## 2019-02-07 ENCOUNTER — Ambulatory Visit (INDEPENDENT_AMBULATORY_CARE_PROVIDER_SITE_OTHER): Payer: Medicaid Other

## 2019-02-07 ENCOUNTER — Other Ambulatory Visit: Payer: Self-pay

## 2019-02-07 ENCOUNTER — Ambulatory Visit (INDEPENDENT_AMBULATORY_CARE_PROVIDER_SITE_OTHER): Payer: Medicaid Other | Admitting: Orthopaedic Surgery

## 2019-02-07 DIAGNOSIS — M545 Low back pain, unspecified: Secondary | ICD-10-CM

## 2019-02-07 DIAGNOSIS — M5441 Lumbago with sciatica, right side: Secondary | ICD-10-CM

## 2019-02-07 DIAGNOSIS — G8929 Other chronic pain: Secondary | ICD-10-CM

## 2019-02-07 DIAGNOSIS — M542 Cervicalgia: Secondary | ICD-10-CM

## 2019-02-07 NOTE — Progress Notes (Signed)
Office Visit Note   Patient: Sandra Guzman           Date of Birth: 1991/06/09           MRN: [id number] Visit Date: 02/07/2019              Requested by: [facility] [address] [place],  [place] [address] PCP: [place]: Visit Diagnoses:  1. Chronic midline low back pain with right-sided sciatica   2. Neck pain   3. Low back pain without sciatica, unspecified back pain laterality, unspecified chronicity     Plan: We discussed modification of activities of daily living try to minimize somewhat she picks her 75-year-old up.  She can get into a chair and let her child crawl up into her lap.  We discussed a walking program.  I suggest no change in her current medication.  She can return if she develops myelopathic or radicular symptoms which we discussed in detail.  Follow-Up Instructions: Return in about 3 months (around 05/10/2019).   Orders:  Orders Placed This Encounter  Procedures  . XR Cervical Spine 2 or 3 views  . XR Lumbar Spine 2-3 Views   No orders of the defined types were placed in this encounter.     Procedures: No procedures performed   Clinical Data: No additional findings.   Subjective: Chief Complaint  Patient presents with  . Neck - Pain  . Lower Back - Pain    HPI 28 year old female referred by [facility] with chronic neck and low back pain.  Patient states she has had pain in her neck which is worse in her back for several months.  Pain radiates into her shoulders has problems laying on one side of the other she feels like her arms are somewhat weak not 1 particular dermatome.  She also has pain with standing with low back pain.  Increased problems with activities of daily living.  Some right leg pain with some numbness and tingling she is taken ibuprofen.  She is currently on methadone the past history of heroin abuse denies heroin use for greater than a year.  Review of Systemspositive  for heroin abuse, childbirth with 60 yo child, UTI, Pyelonephritis. Currently on Methadone    Objective: Vital Signs: There were no vitals taken for this visit.  Physical Exam Constitutional:      Appearance: She is well-developed.  HENT:     Head: Normocephalic.     Right Ear: External ear normal.     Left Ear: External ear normal.  Eyes:     Pupils: Pupils are equal, round, and reactive to light.  Neck:     Thyroid: No thyromegaly.     Trachea: No tracheal deviation.  Cardiovascular:     Rate and Rhythm: Normal rate.  Pulmonary:     Effort: Pulmonary effort is normal.  Abdominal:     Palpations: Abdomen is soft.  Skin:    General: Skin is warm and dry.  Neurological:     Mental Status: She is alert and oriented to person, place, and time.  Psychiatric:        Behavior: Behavior normal.     Ortho Exam patient has no significant brachial plexus tenderness negative Lhermitte, negative Spurling upper extremity reflexes are 2+ and symmetrical.  Lower extremity reflexes are 2+ she is able to heel and toe walk.  Anterior tib gastrocsoleus is intact.  Biceps triceps wrist flexion extension  pronation supination finger extensors flexors are all normal.  Specialty Comments:  No specialty comments available.  Imaging: No results found.   PMFS History: Patient Active Problem List   Diagnosis Date Noted  . Neck pain 02/07/2019  . Heroin abuse (HCC) 03/29/2015  . Pregnancy 03/29/2015  . Sepsis (HCC) 03/29/2015  . UTI (urinary tract infection) 03/29/2015  . Pyelonephritis 03/29/2015   History reviewed. No pertinent past medical history.  History reviewed. No pertinent family history.  Past Surgical History:  Procedure Laterality Date  . CESAREAN SECTION    . DENTAL SURGERY     Social History   Occupational History  . Not on file  Tobacco Use  . Smoking status: Current Every Day Smoker    Packs/day: 1.00    Types: Cigarettes  . Smokeless tobacco: Never Used   Substance and Sexual Activity  . Alcohol use: No  . 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 Sexual activity: Not on file

## 2019-04-12 ENCOUNTER — Other Ambulatory Visit: Payer: Self-pay | Admitting: Physician Assistant

## 2019-04-12 DIAGNOSIS — N6452 Nipple discharge: Secondary | ICD-10-CM

## 2019-04-14 ENCOUNTER — Encounter: Payer: Medicaid Other | Admitting: Family Medicine

## 2019-04-20 ENCOUNTER — Ambulatory Visit
Admission: RE | Admit: 2019-04-20 | Discharge: 2019-04-20 | Disposition: A | Discharge status: Home / Self Care | Payer: Medicaid Other | Source: Ambulatory Visit | Attending: Physician Assistant | Admitting: Physician Assistant

## 2019-04-20 ENCOUNTER — Other Ambulatory Visit: Payer: Self-pay

## 2019-04-20 DIAGNOSIS — N6452 Nipple discharge: Secondary | ICD-10-CM

## 2019-04-21 ENCOUNTER — Other Ambulatory Visit: Payer: Self-pay

## 2019-04-21 ENCOUNTER — Emergency Department (HOSPITAL_COMMUNITY)
Admission: EM | Admit: 2019-04-21 | Discharge: 2019-04-21 | Disposition: A | Discharge status: Home / Self Care | Payer: Medicaid Other | Attending: Emergency Medicine | Admitting: Emergency Medicine

## 2019-04-21 ENCOUNTER — Encounter: Payer: Self-pay | Admitting: Emergency Medicine

## 2019-04-21 ENCOUNTER — Ambulatory Visit
Admission: EM | Admit: 2019-04-21 | Discharge: 2019-04-21 | Disposition: A | Discharge status: Home / Self Care | Payer: Medicaid Other

## 2019-04-21 DIAGNOSIS — Z5321 Procedure and treatment not carried out due to patient leaving prior to being seen by health care provider: Secondary | ICD-10-CM | POA: Insufficient documentation

## 2019-04-21 DIAGNOSIS — N764 Abscess of vulva: Secondary | ICD-10-CM | POA: Diagnosis not present

## 2019-04-21 NOTE — ED Notes (Signed)
Patient able to ambulate independently  

## 2019-04-21 NOTE — ED Notes (Signed)
Pt states that she is leaving due to wait time and will go to urgent care in the morning.

## 2019-04-21 NOTE — ED Triage Notes (Signed)
Per pt she has an abscess on the right labia minora. . Pt said it started 2 days ago and has gotten bigger in the last day. Says very painful.

## 2019-04-21 NOTE — Discharge Instructions (Addendum)
Continue antibiotic as prescribed for infection. Warm soaks to area. Follow up for further evaluation if fever or worsening of pain or redness.

## 2019-04-21 NOTE — ED Provider Notes (Signed)
[facility]    CSN: [id number] Arrival date & time: 04/21/19  0848      History   Chief Complaint Chief Complaint  Patient presents with  . Abscess    HPI Sandra Guzman is a 28 y.o. female.   28 year old female comes in for few day history of abscess to the right labia majora.  States had noticed similar symptoms a month ago without pain.  This resolved prior to current symptom onset.  In the past few days, noticed increase in size, and increasing pain.  Unknown erythema due to location.  Denies fever, chills, body aches.  Patient was started on Bactrim 2 days ago by PCP with any relief.  Patient does shave the area, last shaved a few weeks ago.     History reviewed. No pertinent past medical history.  Patient Active Problem List   Diagnosis Date Noted  . Neck pain 02/07/2019  . Heroin abuse ([place]) 03/29/2015  . Pregnancy 03/29/2015  . Sepsis ([place]) 03/29/2015  . UTI (urinary tract infection) 03/29/2015  . Pyelonephritis 03/29/2015    Past Surgical History:  Procedure Laterality Date  . CESAREAN SECTION    . DENTAL SURGERY      OB History    Gravida  1   Para      Term      Preterm      AB      Living        SAB      TAB      Ectopic      Multiple      Live Births               Home Medications    Prior to Admission medications   Medication Sig Start Date End Date Taking? Authorizing Provider  busPIRone (BUSPAR) 10 MG tablet Take 10 mg by mouth 3 (three) times daily.   Yes [provider]  sulfamethoxazole-trimethoprim (BACTRIM DS) 800-160 MG tablet Take 1 tablet by mouth 2 (two) times daily.   Yes [provider]  acetaminophen (TYLENOL) 500 MG tablet Take 2,000 mg by mouth every 6 (six) hours as needed for mild pain.    [provider]  cefpodoxime (VANTIN) 200 MG tablet Take 1 tablet (200 mg total) by mouth 2 (two) times daily. 03/30/15   Thurnell Lose, MD  diphenhydrAMINE (BENADRYL) 25 MG  tablet Take 50 mg by mouth every 6 (six) hours as needed for allergies.    [provider]  methadone (DOLOPHINE) 10 MG tablet Take 65 mg by mouth daily.    [provider]  PARoxetine (PAXIL) 20 MG tablet Take 20 mg by mouth daily.    [provider]    Family History Family History  Problem Relation Age of Onset  . Breast cancer Maternal Grandmother     Social History Social History   Tobacco Use  . Smoking status: Current Every Day Smoker    Packs/day: 1.00    Types: Cigarettes  . Smokeless tobacco: Never Used  Substance Use Topics  . Alcohol use: No  . Drug use: No    Comment: Former Heroine user 2016     Allergies   Patient has no known allergies.   Review of Systems Review of Systems  Reason unable to perform ROS: See HPI as above.     Physical Exam Triage Vital Signs ED Triage Vitals  Enc Vitals Group     BP 04/21/19 0858 118/72  Pulse Rate 04/21/19 0858 (!) 105     Resp 04/21/19 0858 18     Temp 04/21/19 0858 98.3 F (36.8 C)     Temp Source 04/21/19 0858 Oral     SpO2 04/21/19 0858 98 %     Weight --      Height --      Head Circumference --      Peak Flow --      Pain Score 04/21/19 0857 8     Pain Loc --      Pain Edu? --      Excl. in GC? --    No data found.  Updated Vital Signs BP 118/72 (BP Location: Left Arm)   Pulse (!) 105   Temp 98.3 F (36.8 C) (Oral)   Resp 18   LMP 02/01/2019   SpO2 98%   Physical Exam Constitutional:      General: She is not in acute distress.    Appearance: Normal appearance. She is not ill-appearing or diaphoretic.  HENT:     Head: Normocephalic and atraumatic.  Pulmonary:     Effort: Pulmonary effort is normal.  Genitourinary:    Comments: Swelling with erythema/induration of 3cm x 3cm to right inferior labia majora. No drainage noted. No warmth. Tenderness to palpation.  Skin:    General: Skin is warm and dry.  Neurological:     Mental Status: She is alert.   Psychiatric:        Mood and Affect: Mood normal.        Behavior: Behavior normal.     UC Treatments / Results  Labs (all labs ordered are listed, but only abnormal results are displayed) Labs Reviewed - No data to display  EKG   Radiology  Procedures Incision and Drainage  Date/Time: 04/21/2019 9:39 AM Performed by: Belinda FisherYu, Jacque Garrels V, PA-C Authorized by: Belinda FisherYu, Lianah Peed V, PA-C   Consent:    Consent obtained:  Verbal   Consent given by:  Patient   Risks discussed:  Bleeding, incomplete drainage, pain and infection   Alternatives discussed:  No treatment, alternative treatment and referral Universal protocol:    Patient identity confirmed:  Verbally with patient Location:    Type:  Abscess   Size:  3cm x 3cm   Location:  Anogenital   Anogenital location:  Vulva Pre-procedure details:    Skin preparation:  Chloraprep Anesthesia (see MAR for exact dosages):    Anesthesia method:  Local infiltration   Local anesthetic:  Lidocaine 2% WITH epi Procedure type:    Complexity:  Simple Procedure details:    Needle aspiration: no     Incision types:  Single straight   Incision depth:  Subcutaneous   Scalpel blade:  11   Wound management:  Probed and deloculated   Drainage:  Purulent and bloody   Drainage amount:  Moderate   Wound treatment:  Wound left open   Packing materials:  None Post-procedure details:    Patient tolerance of procedure:  Tolerated well, no immediate complications   (including critical care time)  Medications Ordered in UC Medications - No data to display  Initial Impression / Assessment and Plan / UC Course  I have reviewed the triage vital signs and the nursing notes.  Pertinent labs & imaging results that were available during my care of the patient were reviewed by me and considered in my medical decision making (see chart for details).    Patient tolerated procedure well.  Continue Bactrim as  directed per PCP. Wound care instructions given. Return  precautions given. Patient expresses understanding and agrees to plan.    Final Clinical Impressions(s) / UC Diagnoses   Final diagnoses:  Abscess of vulva    ED Prescriptions    None        Belinda Fisher, PA-C 04/21/19 1137

## 2019-04-21 NOTE — ED Triage Notes (Signed)
Pt presents to [facility] for assessment of abscess to labia.  States another abscess x 1 month without pain, but then a second one grew, and has now spread in size and is painful.

## 2019-05-09 ENCOUNTER — Ambulatory Visit: Payer: Medicaid Other | Admitting: Orthopaedic Surgery

## 2019-07-09 ENCOUNTER — Encounter (HOSPITAL_BASED_OUTPATIENT_CLINIC_OR_DEPARTMENT_OTHER): Payer: Self-pay | Admitting: Emergency Medicine

## 2019-07-09 ENCOUNTER — Emergency Department (HOSPITAL_BASED_OUTPATIENT_CLINIC_OR_DEPARTMENT_OTHER)
Admission: EM | Admit: 2019-07-09 | Discharge: 2019-07-09 | Disposition: A | Discharge status: Home / Self Care | Payer: Medicaid Other | Attending: Emergency Medicine | Admitting: Emergency Medicine

## 2019-07-09 ENCOUNTER — Other Ambulatory Visit: Payer: Self-pay

## 2019-07-09 DIAGNOSIS — F419 Anxiety disorder, unspecified: Secondary | ICD-10-CM | POA: Insufficient documentation

## 2019-07-09 DIAGNOSIS — Z79899 Other long term (current) drug therapy: Secondary | ICD-10-CM | POA: Diagnosis not present

## 2019-07-09 DIAGNOSIS — F410 Panic disorder [episodic paroxysmal anxiety] without agoraphobia: Secondary | ICD-10-CM | POA: Diagnosis present

## 2019-07-09 DIAGNOSIS — Z760 Encounter for issue of repeat prescription: Secondary | ICD-10-CM | POA: Insufficient documentation

## 2019-07-09 DIAGNOSIS — F17210 Nicotine dependence, cigarettes, uncomplicated: Secondary | ICD-10-CM | POA: Diagnosis not present

## 2019-07-09 HISTORY — DX: Anxiety disorder, unspecified: F41.9

## 2019-07-09 NOTE — Discharge Instructions (Signed)
Call [facility] at [phone] extension [phone] or [phone] to make an appointment to be seen tomorrow.

## 2019-07-09 NOTE — ED Triage Notes (Addendum)
Pt c/o anxiety and panic attack since yesterday, worse than normal. She takes klonopin which she is out of.

## 2019-07-09 NOTE — ED Provider Notes (Signed)
[facility] EMERGENCY DEPARTMENT Provider Note   CSN: [id number] Arrival date & time: 07/09/19  1159     History   Chief Complaint Chief Complaint  Patient presents with  . Anxiety  . Medication Refill    HPI Sandra Guzman is a 28 y.o. female.     HPI   Sandra Guzman is a 28 y.o. female, with a history of anxiety, heroin abuse, presenting to the ED with anxiety and panic attacks over the last 2 days. She states she was prescribed fifteen 0.5mg  klonopin tablets from her PCP, filled at the beginning of November. She was started back on this medication in October with a new doctor and they have been testing out dosages.  She has been trying to get back in with her PCP for the last couple weeks due to increased stress in her life over the last several weeks. She thinks she has been misreading her klonopin prescription because when she ran out she noted that it said she was supposed to take 0.25mg  (1/2 tablet) daily, she was prescribed 0.5mg  talblets, so when the prescription said take 0.5 tablet she thought it meant take one 0.5mg  tablet. Her doctor's office told her that her doctor would be back next week and could see her at that time.  Denies SI/HI.  Denies current chest pain, shortness of breath, fever, syncope, or any other complaints.   Past Medical History:  Diagnosis Date  . Anxiety     Patient Active Problem List   Diagnosis Date Noted  . Neck pain 02/07/2019  . Heroin abuse (HCC) 03/29/2015  . Pregnancy 03/29/2015  . Sepsis (HCC) 03/29/2015  . UTI (urinary tract infection) 03/29/2015  . Pyelonephritis 03/29/2015    Past Surgical History:  Procedure Laterality Date  . CESAREAN SECTION    . DENTAL SURGERY       OB History    Gravida  1   Para      Term      Preterm      AB      Living        SAB      TAB      Ectopic      Multiple      Live Births               Home Medications    Prior to Admission medications    Medication Sig Start Date End Date Taking? Authorizing Provider  acetaminophen (TYLENOL) 500 MG tablet Take 2,000 mg by mouth every 6 (six) hours as needed for mild pain.    [provider]  busPIRone (BUSPAR) 10 MG tablet Take 10 mg by mouth 3 (three) times daily.    [provider]  cefpodoxime (VANTIN) 200 MG tablet Take 1 tablet (200 mg total) by mouth 2 (two) times daily. 03/30/15   Leroy SeaSingh, Prashant K, MD  diphenhydrAMINE (BENADRYL) 25 MG tablet Take 50 mg by mouth every 6 (six) hours as needed for allergies.    [provider]  methadone (DOLOPHINE) 10 MG tablet Take 65 mg by mouth daily.    [provider]  PARoxetine (PAXIL) 20 MG tablet Take 20 mg by mouth daily.    [provider]  sulfamethoxazole-trimethoprim (BACTRIM DS) 800-160 MG tablet Take 1 tablet by mouth 2 (two) times daily.    [provider]    Family History Family History  Problem Relation Age of Onset  . Breast cancer Maternal Grandmother  Social History Social History   Tobacco Use  . Smoking status: Current Every Day Smoker    Packs/day: 1.00    Types: Cigarettes  . Smokeless tobacco: Never Used  Substance Use Topics  . Alcohol use: No  . Drug use: No    Comment: Former Heroine user 2016     Allergies   Patient has no known allergies.   Review of Systems Review of Systems  Constitutional: Negative for fever.  Respiratory: Negative for shortness of breath.   Cardiovascular: Negative for chest pain.  Gastrointestinal: Negative for abdominal pain, diarrhea, nausea and vomiting.  Neurological: Negative for dizziness, syncope and light-headedness.  Psychiatric/Behavioral: Negative for suicidal ideas. The patient is nervous/anxious.   All other systems reviewed and are negative.    Physical Exam Updated Vital Signs BP 112/73 (BP Location: Left Arm)   Pulse 99   Temp 99.2 F (37.3 C) (Oral)   Resp 18   Ht 5' (1.524 m)   Wt 80.7 kg   LMP  07/05/2019   SpO2 97%   BMI 34.76 kg/m   Physical Exam Vitals signs and nursing note reviewed.  Constitutional:      General: She is not in acute distress.    Appearance: She is well-developed. She is not diaphoretic.  HENT:     Head: Normocephalic and atraumatic.  Eyes:     Conjunctiva/sclera: Conjunctivae normal.  Neck:     Musculoskeletal: Neck supple.  Cardiovascular:     Rate and Rhythm: Normal rate and regular rhythm.  Pulmonary:     Effort: Pulmonary effort is normal.  Skin:    General: Skin is warm and dry.     Coloration: Skin is not pale.  Neurological:     Mental Status: She is alert and oriented to person, place, and time.  Psychiatric:        Attention and Perception: Attention normal.        Mood and Affect: Mood and affect normal.        Behavior: Behavior normal.      ED Treatments / Results  Labs (all labs ordered are listed, but only abnormal results are displayed) Labs Reviewed - No data to display  EKG None  Radiology No results found.  Procedures Procedures (including critical care time)  Medications Ordered in ED Medications - No data to display   Initial Impression / Assessment and Plan / ED Course  I have reviewed the triage vital signs and the nursing notes.  Pertinent labs & imaging results that were available during my care of the patient were reviewed by me and considered in my medical decision making (see chart for details).  Clinical Course as of Jul 08 1414  Sun Jul 09, 2019  1356 Spoke with Bonita Quin, CMA at [facility].  She states patient was supposed to be taking 0.25 mg of her klonopin daily.  Since she has prescribed a controlled substance from [facility], if she gets a controlled substance prescription from anywhere else she will be discharged from the practice. She states the patient can be seen in one of their offices tomorrow as there are appointments available.   They also typically have emergency mental  health appointments available most days. Have patient call [phone] extension [phone] or [phone].   [SJ]    Clinical Course User Index [SJ] Joy, Shawn C, PA-C       Patient presents with complaints of anxiety. She will be able to be seen by a psych provider  in her home practice tomorrow. Return precautions discussed.  Patient voices understanding of these instructions, accepts the plan, and is comfortable with discharge.  Final Clinical Impressions(s) / ED Diagnoses   Final diagnoses:  Anxiety    ED Discharge Orders    None       Layla Maw 07/09/19 1416    Davonna Belling, MD 07/09/19 1513

## 2019-11-13 ENCOUNTER — Encounter: Payer: Medicaid Other | Admitting: Obstetrics & Gynecology

## 2019-11-13 DIAGNOSIS — Z01419 Encounter for gynecological examination (general) (routine) without abnormal findings: Secondary | ICD-10-CM

## 2020-04-22 ENCOUNTER — Emergency Department (HOSPITAL_BASED_OUTPATIENT_CLINIC_OR_DEPARTMENT_OTHER)
Admission: EM | Admit: 2020-04-22 | Discharge: 2020-04-22 | Disposition: A | Discharge status: Home / Self Care | Payer: Medicaid Other | Attending: Emergency Medicine | Admitting: Emergency Medicine

## 2020-04-22 ENCOUNTER — Other Ambulatory Visit: Payer: Self-pay

## 2020-04-22 ENCOUNTER — Encounter (HOSPITAL_BASED_OUTPATIENT_CLINIC_OR_DEPARTMENT_OTHER): Payer: Self-pay

## 2020-04-22 DIAGNOSIS — Z760 Encounter for issue of repeat prescription: Secondary | ICD-10-CM | POA: Insufficient documentation

## 2020-04-22 DIAGNOSIS — Z5321 Procedure and treatment not carried out due to patient leaving prior to being seen by health care provider: Secondary | ICD-10-CM | POA: Diagnosis not present

## 2020-04-22 HISTORY — DX: Attention-deficit hyperactivity disorder, unspecified type: F90.9

## 2020-04-22 HISTORY — DX: Depression, unspecified: F32.A

## 2020-04-22 HISTORY — DX: Insomnia, unspecified: G47.00

## 2020-04-22 HISTORY — DX: Post-traumatic stress disorder, unspecified: F43.10

## 2020-04-22 HISTORY — DX: Other psychoactive substance use, unspecified, uncomplicated: F19.90

## 2020-04-22 NOTE — ED Triage Notes (Addendum)
Pt requesting refills on klonopin, adderall and paxil-states she lost meds in a recent move-[place]-steady gait

## 2020-04-28 ENCOUNTER — Other Ambulatory Visit: Payer: Self-pay

## 2020-04-28 ENCOUNTER — Emergency Department (HOSPITAL_BASED_OUTPATIENT_CLINIC_OR_DEPARTMENT_OTHER)
Admission: EM | Admit: 2020-04-28 | Discharge: 2020-04-28 | Discharge status: Against Medical Advice | Payer: Medicaid Other

## 2020-12-15 IMAGING — US US BREAST*L* LIMITED INC AXILLA
1 series · 8 of 8 positions shown · non-contrast
Comparison: None

CLINICAL DATA: 28-year-old female with lump in the LEFT axilla and
bilateral milky nipple discharge.

EXAM:
ULTRASOUND OF THE BILATERAL BREAST

[Series 1: us breast*left* limited inc axilla · 0.06mm/px · 8 of 8 slices shown]
[im 1/8]
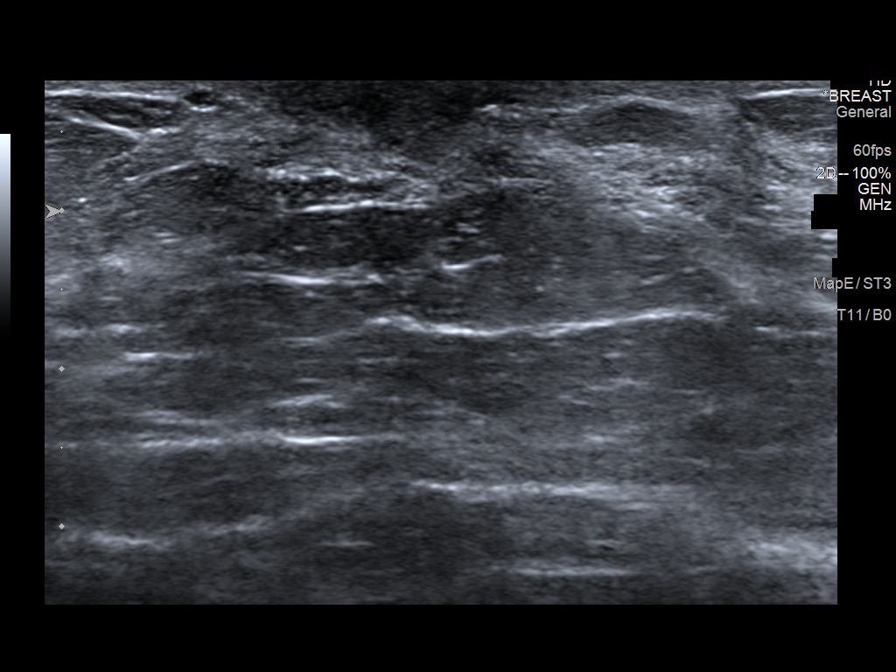
[im 2/8]
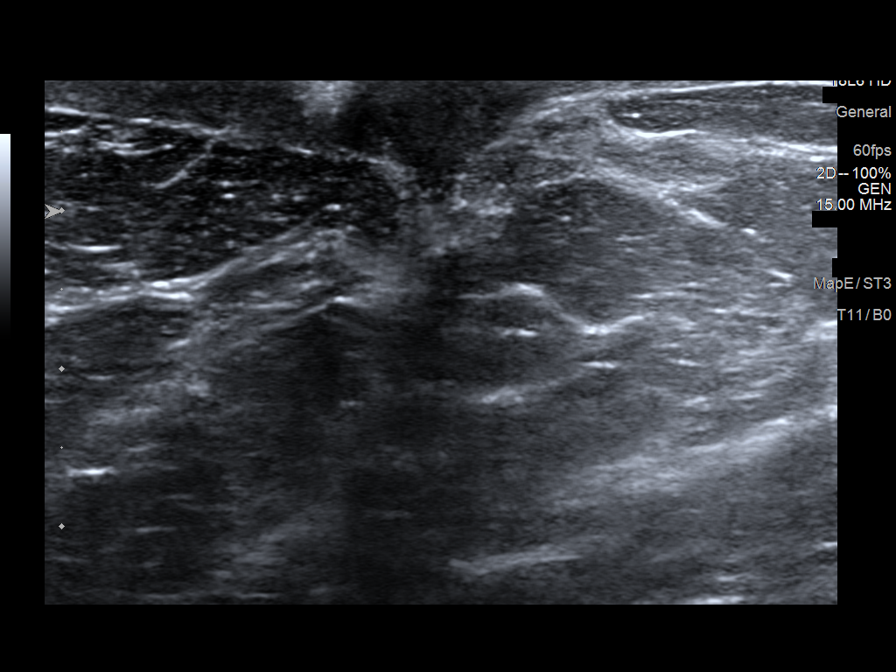
[im 3/8]
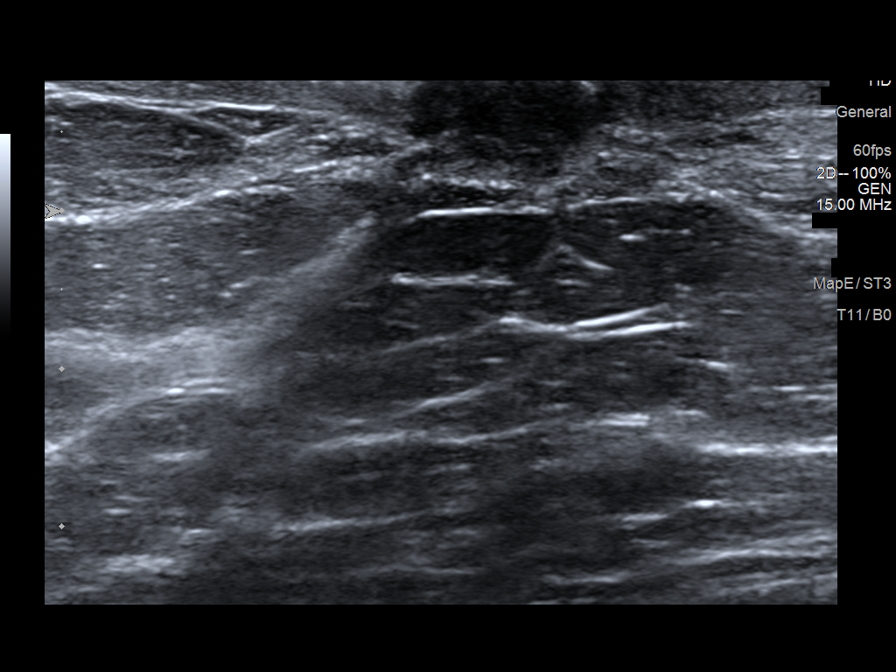
[im 4/8]
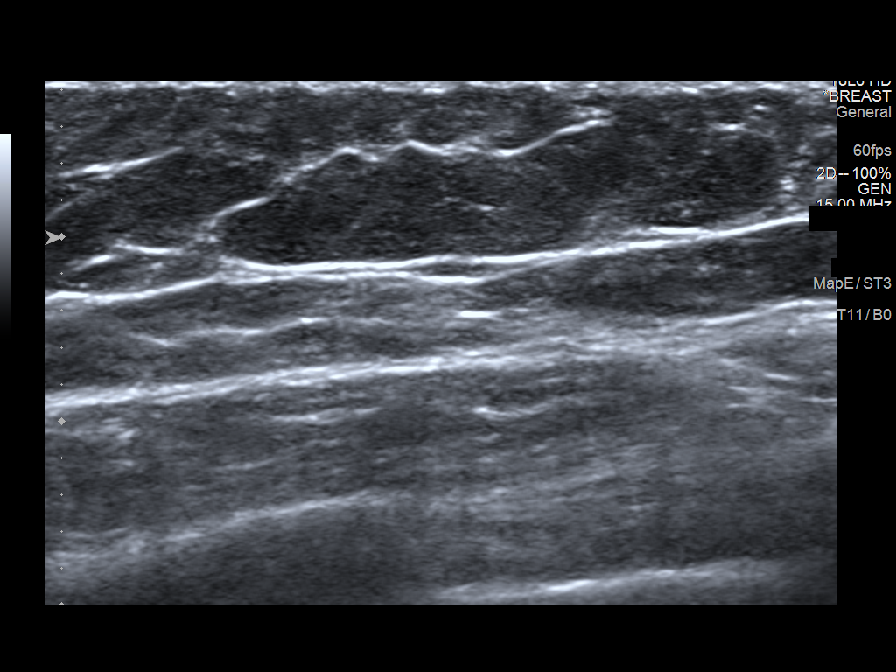
[im 5/8]
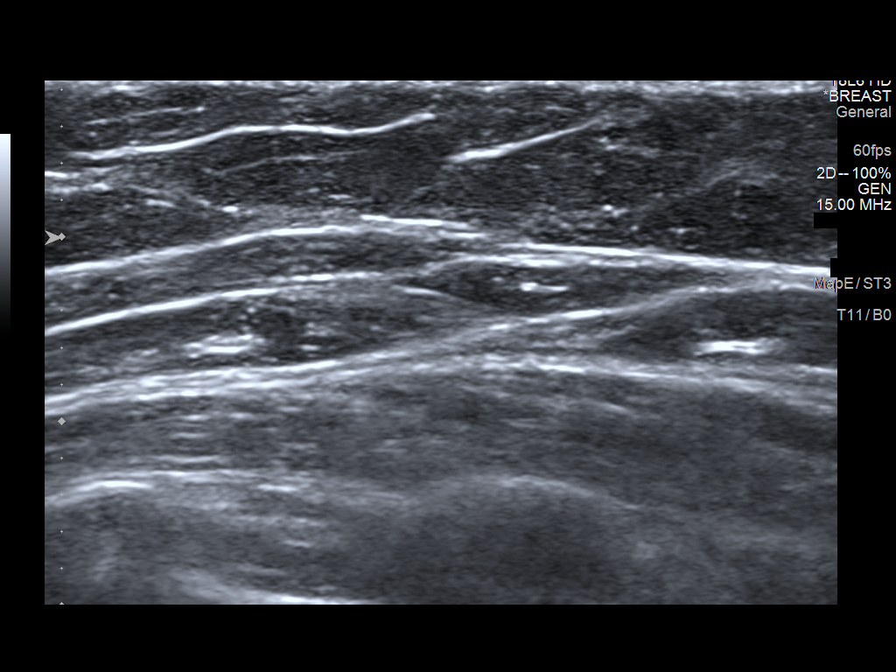
[im 6/8]
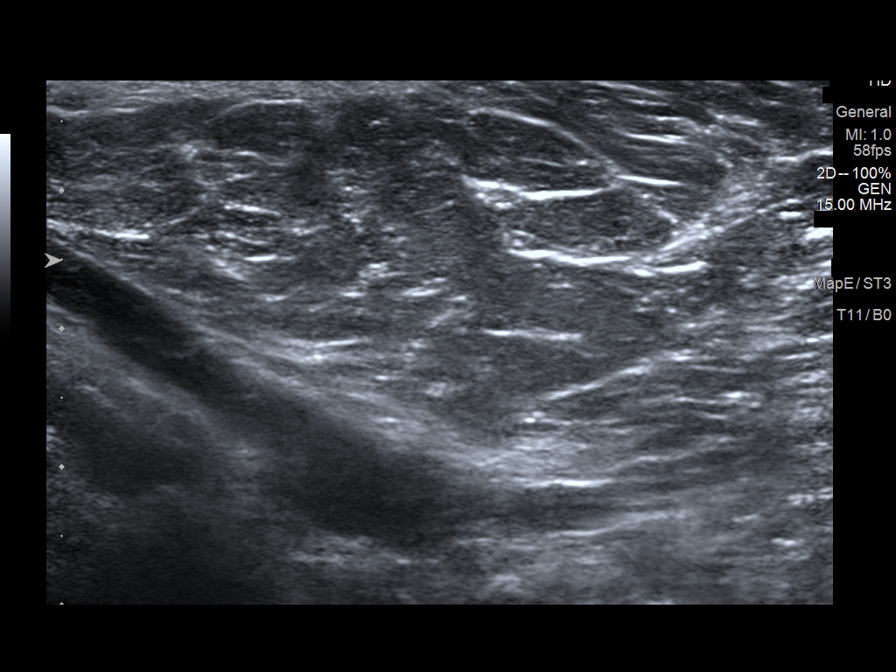
[im 7/8]
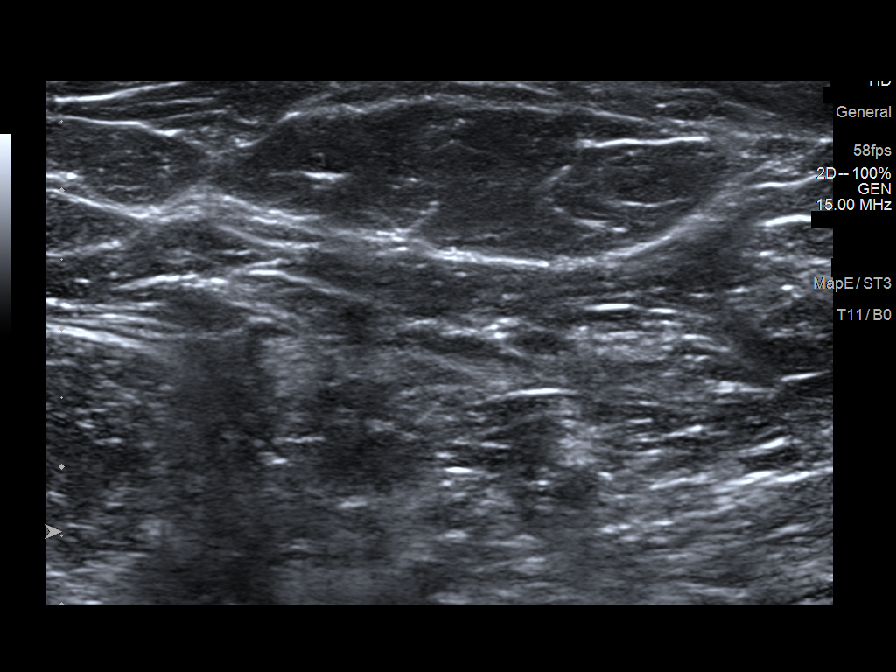
[im 8/8]
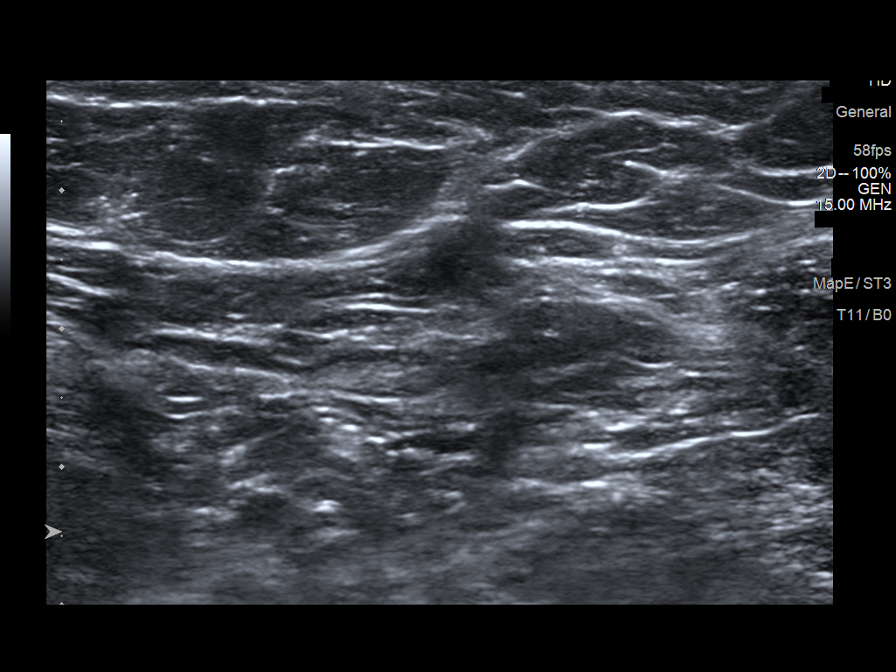

[8 of 8 positions shown; findings below may reference images not displayed]

FINDINGS: On physical exam, soft fullness in the LEFT axillary region is
identified.

Targeted ultrasound is performed, showing no solid or cystic mass,
distortion or abnormal shadowing within the RETROAREOLAR breasts.

No solid or cystic mass, distortion, abnormal shadowing or abnormal
lymph nodes are noted within the LEFT axilla at the site of soft
fullness. This fullness represents normal adipose tissue.
IMPRESSION: 1. Palpable soft fullness in the LEFT axillary region represents
normal adipose tissue. No sonographic abnormalities in the LEFT
axilla.
2. Benign nipple discharge by history. No sonographic abnormalities
in the RETROAREOLAR breasts.

RECOMMENDATION:
Clinical follow-up of nipple discharge if there is concern for
prolactinoma.

Bilateral screening mammogram at age 40.

I have discussed the findings and recommendations with the patient.
If applicable, a reminder letter will be sent to the patient
regarding the next appointment.

BI-RADS CATEGORY  1: Negative.

## 2020-12-15 IMAGING — US US BREAST*R* LIMITED INC AXILLA
1 series · 2 of 2 positions shown · non-contrast
Comparison: None

CLINICAL DATA: 28-year-old female with lump in the LEFT axilla and
bilateral milky nipple discharge.

EXAM:
ULTRASOUND OF THE BILATERAL BREAST

[Series 1: us breast*right* limited inc axilla · 0.06mm/px · 2 of 2 slices shown]
[im 1/2]
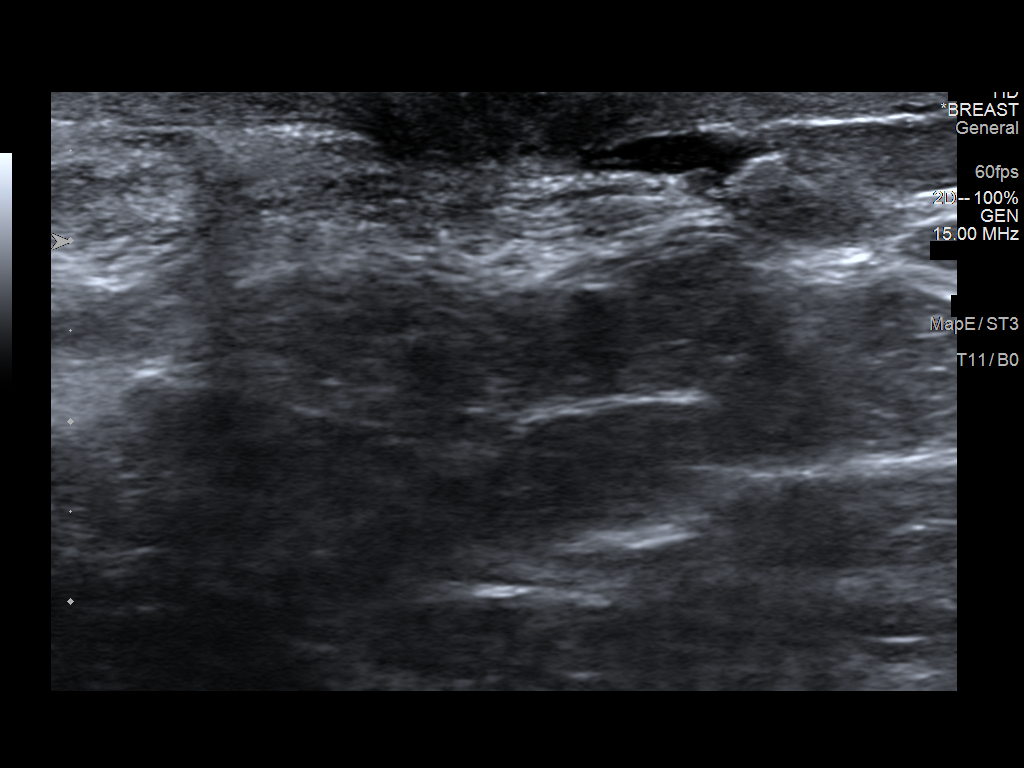
[im 2/2]
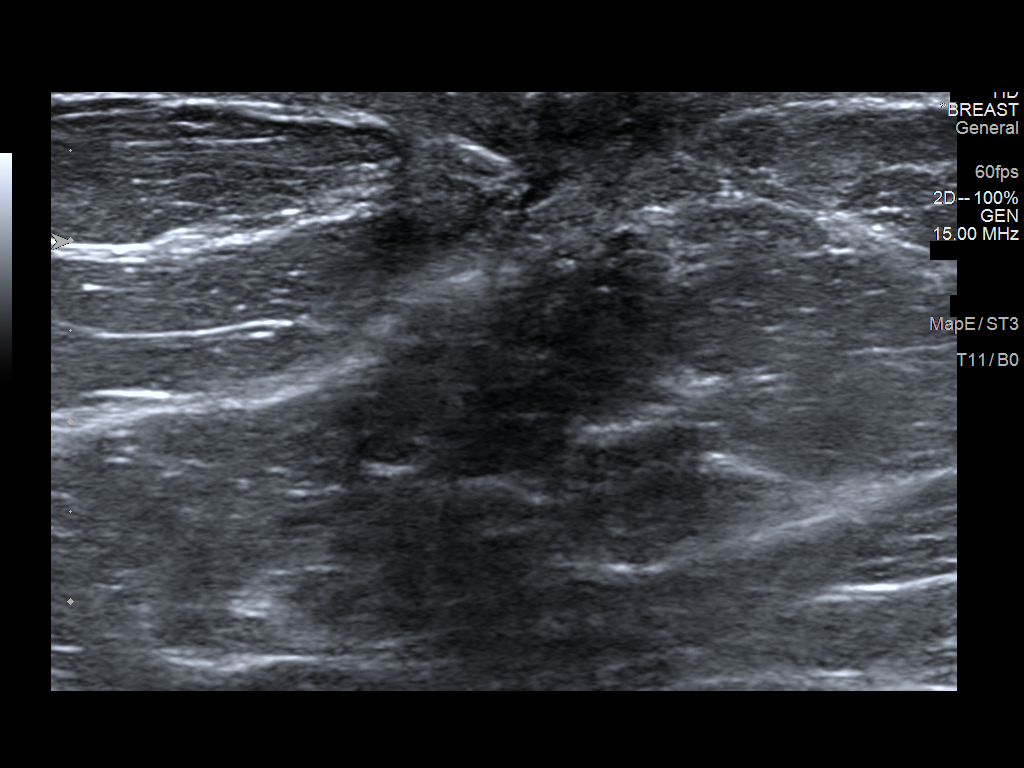

[2 of 2 positions shown; findings below may reference images not displayed]

FINDINGS: On physical exam, soft fullness in the LEFT axillary region is
identified.

Targeted ultrasound is performed, showing no solid or cystic mass,
distortion or abnormal shadowing within the RETROAREOLAR breasts.

No solid or cystic mass, distortion, abnormal shadowing or abnormal
lymph nodes are noted within the LEFT axilla at the site of soft
fullness. This fullness represents normal adipose tissue.
IMPRESSION: 1. Palpable soft fullness in the LEFT axillary region represents
normal adipose tissue. No sonographic abnormalities in the LEFT
axilla.
2. Benign nipple discharge by history. No sonographic abnormalities
in the RETROAREOLAR breasts.

RECOMMENDATION:
Clinical follow-up of nipple discharge if there is concern for
prolactinoma.

Bilateral screening mammogram at age 40.

I have discussed the findings and recommendations with the patient.
If applicable, a reminder letter will be sent to the patient
regarding the next appointment.

BI-RADS CATEGORY  1: Negative.

## 2023-06-01 ENCOUNTER — Telehealth: Payer: Self-pay | Admitting: Hematology and Oncology

## 2023-06-01 NOTE — Telephone Encounter (Signed)
Called and spoke with Kelby Fam who stated he was informed by patient she relocated to [place]. Kelby Fam states he will reach out to her to have her call office. Number was provided to [facility].

## 2023-06-09 ENCOUNTER — Telehealth: Payer: Self-pay | Admitting: Oncology

## 2023-06-09 NOTE — Telephone Encounter (Signed)
Called and left message to call office for scheduling . No answer advise patient this is the fourth attempt the referral will be closed today and referring office will be notified.

## 2023-11-10 ENCOUNTER — Institutional Professional Consult (permissible substitution) (INDEPENDENT_AMBULATORY_CARE_PROVIDER_SITE_OTHER): Payer: MEDICAID | Admitting: Otolaryngology
